# Patient Record
Sex: Female | Born: 1980 | Race: White | Hispanic: No | Marital: Married | Smoking: Never smoker
Health system: Southern US, Community
[De-identification: ages and names within clinical notes are randomized; demographics above are authoritative.]

## PROBLEM LIST (undated history)

## (undated) ENCOUNTER — Inpatient Hospital Stay (HOSPITAL_COMMUNITY): Payer: Self-pay

## (undated) DIAGNOSIS — Z789 Other specified health status: Secondary | ICD-10-CM

## (undated) DIAGNOSIS — R112 Nausea with vomiting, unspecified: Secondary | ICD-10-CM

## (undated) DIAGNOSIS — Z9889 Other specified postprocedural states: Secondary | ICD-10-CM

## (undated) DIAGNOSIS — Z8619 Personal history of other infectious and parasitic diseases: Secondary | ICD-10-CM

## (undated) HISTORY — PX: KNEE SURGERY: SHX244

## (undated) HISTORY — DX: Personal history of other infectious and parasitic diseases: Z86.19

---

## 2005-08-13 ENCOUNTER — Other Ambulatory Visit: Admission: RE | Admit: 2005-08-13 | Discharge: 2005-08-13 | Payer: Self-pay | Admitting: Obstetrics and Gynecology

## 2006-08-14 ENCOUNTER — Other Ambulatory Visit: Admission: RE | Admit: 2006-08-14 | Discharge: 2006-08-14 | Payer: Self-pay | Admitting: Obstetrics and Gynecology

## 2012-01-02 LAB — HM COLONOSCOPY

## 2012-04-13 LAB — OB RESULTS CONSOLE GC/CHLAMYDIA
Chlamydia: NEGATIVE
Gonorrhea: NEGATIVE

## 2012-04-13 LAB — OB RESULTS CONSOLE ABO/RH: RH Type: POSITIVE

## 2012-04-13 LAB — OB RESULTS CONSOLE HIV ANTIBODY (ROUTINE TESTING): HIV: NONREACTIVE

## 2012-04-13 LAB — OB RESULTS CONSOLE RPR: RPR: NONREACTIVE

## 2012-08-27 ENCOUNTER — Encounter (HOSPITAL_COMMUNITY): Payer: Self-pay | Admitting: *Deleted

## 2012-08-27 ENCOUNTER — Inpatient Hospital Stay (HOSPITAL_COMMUNITY)
Admission: AD | Admit: 2012-08-27 | Discharge: 2012-08-27 | Disposition: A | Discharge status: Home / Self Care | Payer: BC Managed Care – PPO | Source: Ambulatory Visit | Attending: Obstetrics and Gynecology | Admitting: Obstetrics and Gynecology

## 2012-08-27 DIAGNOSIS — O368190 Decreased fetal movements, unspecified trimester, not applicable or unspecified: Secondary | ICD-10-CM | POA: Insufficient documentation

## 2012-08-27 HISTORY — DX: Other specified health status: Z78.9

## 2012-08-27 HISTORY — DX: Other specified postprocedural states: R11.2

## 2012-08-27 HISTORY — DX: Other specified postprocedural states: Z98.890

## 2012-08-27 NOTE — MAU Provider Note (Signed)
  History     CSN: [id number]  Arrival date and time: 08/27/12 2204   First Provider Initiated Contact with Patient 08/27/12 2343      Chief Complaint  Patient presents with  . Decreased Fetal Movement   HPI  [name] is a 31 y.o. G1P0 at [redacted]w[redacted]d who presents today with decreased fetal movement. She felt the baby move some at work today, and then when she came home from work the baby was not following her normal movement patterns after dinner.   Past Medical History  Diagnosis Date  . PONV (postoperative nausea and vomiting)   . No pertinent past medical history     Past Surgical History  Procedure Date  . Knee surgery     No family history on file.  History  Substance Use Topics  . Smoking status: Never Smoker   . Smokeless tobacco: Not on file  . Alcohol Use: No    Allergies: Allergies not on file  No prescriptions prior to admission    ROS Physical Exam   Blood pressure 125/69, pulse 72, temperature 98 F (36.7 C), temperature source Oral, resp. rate 20, height 5\' 3"  (1.6 m), weight 53.978 kg (119 lb).  Physical Exam  Nursing note and vitals reviewed. Constitutional: She is oriented to person, place, and time. She appears well-developed and well-nourished.  Cardiovascular: Normal rate.   Respiratory: Effort normal.  GI: Soft.  Genitourinary:        FHT 140, moderate with 15X15 accels, no decels Toco: no UCs.   Neurological: She is alert and oriented to person, place, and time.  Skin: Skin is warm and dry.    MAU Course  Procedures  2350: Spoke with Dr. Henderson Cloud. Pt ok for DC home.   Assessment and Plan   1. Decreased fetal movement in pregnancy, antepartum   Reactive NST Reassuring M-F status FU as scheduled with Dr. Henderson Cloud Fetal Kick counts.  Tawnya Crook 08/27/2012, 11:43 PM

## 2012-08-27 NOTE — MAU Note (Signed)
Patient reports feeling positive fetal movement since being put on efm.

## 2012-08-27 NOTE — MAU Note (Signed)
Baby active earlier today but tonight noticed was not as active. From 2030 to 2130 only felt 3 kicks.

## 2012-10-27 NOTE — L&D Delivery Note (Signed)
Delivery Note At 11:26 AM a viable female was delivered via Vaginal, Spontaneous Delivery (Presentation: ;  ).  APGAR: , ; weight .   Placenta status: Intact, Manual removal>>>intact  Tight nuchal cord X 2>>clamped + cut  Pathology.  Cord: 3 vessels with the following complications: None.  Cord pH: sent  Anesthesia: Epidural  Episiotomy: None Lacerations: 1st degree Suture Repair: 3.0 vicryl rapide Est. Blood Loss (mL): 400  Mom to postpartum.  Baby to nursery-stable.  Meriel Pica 11/12/2012, 11:38 AM

## 2012-11-04 ENCOUNTER — Telehealth (HOSPITAL_COMMUNITY): Payer: Self-pay | Admitting: *Deleted

## 2012-11-04 ENCOUNTER — Encounter (HOSPITAL_COMMUNITY): Payer: Self-pay | Admitting: *Deleted

## 2012-11-04 LAB — OB RESULTS CONSOLE GBS: GBS: NEGATIVE

## 2012-11-04 NOTE — Telephone Encounter (Signed)
Preadmission screen  

## 2012-11-11 ENCOUNTER — Inpatient Hospital Stay (HOSPITAL_COMMUNITY)
Admission: RE | Admit: 2012-11-11 | Discharge: 2012-11-14 | DRG: 372 | Disposition: A | Discharge status: Home / Self Care | Payer: BC Managed Care – PPO | Source: Ambulatory Visit | Attending: Obstetrics and Gynecology | Admitting: Obstetrics and Gynecology

## 2012-11-11 ENCOUNTER — Encounter (HOSPITAL_COMMUNITY): Payer: Self-pay

## 2012-11-11 DIAGNOSIS — O365990 Maternal care for other known or suspected poor fetal growth, unspecified trimester, not applicable or unspecified: Secondary | ICD-10-CM | POA: Diagnosis present

## 2012-11-11 LAB — CBC
Hemoglobin: 12.3 g/dL (ref 12.0–15.0)
Platelets: 215 10*3/uL (ref 150–400)
RBC: 3.55 MIL/uL — ABNORMAL LOW (ref 3.87–5.11)

## 2012-11-11 MED ORDER — MISOPROSTOL 25 MCG QUARTER TABLET
25.0000 ug | ORAL_TABLET | ORAL | Status: DC | PRN
  Administered 2012-11-11 – 2012-11-12 (×2): 25 ug via VAGINAL
  Filled 2012-11-11 (×2): qty 0.25

## 2012-11-11 MED ORDER — ONDANSETRON HCL 4 MG/2ML IJ SOLN: 4.0000 mg | Freq: Four times a day (QID) | INTRAMUSCULAR | Status: DC | PRN

## 2012-11-11 MED ORDER — FLEET ENEMA 7-19 GM/118ML RE ENEM: 1.0000 | ENEMA | Freq: Every day | RECTAL | Status: DC | PRN

## 2012-11-11 MED ORDER — OXYTOCIN 40 UNITS IN LACTATED RINGERS INFUSION - SIMPLE MED
62.5000 mL/h | INTRAVENOUS | Status: DC
  Administered 2012-11-12: 62.5 mL/h via INTRAVENOUS
  Filled 2012-11-11: qty 1000

## 2012-11-11 MED ORDER — OXYTOCIN BOLUS FROM INFUSION: 500.0000 mL | INTRAVENOUS | Status: DC

## 2012-11-11 MED ORDER — CITRIC ACID-SODIUM CITRATE 334-500 MG/5ML PO SOLN: 30.0000 mL | ORAL | Status: DC | PRN

## 2012-11-11 MED ORDER — BUTORPHANOL TARTRATE 1 MG/ML IJ SOLN: 2.0000 mg | INTRAMUSCULAR | Status: DC | PRN

## 2012-11-11 MED ORDER — LIDOCAINE HCL (PF) 1 % IJ SOLN
30.0000 mL | INTRAMUSCULAR | Status: DC | PRN
  Filled 2012-11-11: qty 30

## 2012-11-11 MED ORDER — LACTATED RINGERS IV SOLN
INTRAVENOUS | Status: DC
  Administered 2012-11-11 – 2012-11-12 (×2): via INTRAVENOUS

## 2012-11-11 MED ORDER — TERBUTALINE SULFATE 1 MG/ML IJ SOLN: 0.2500 mg | Freq: Once | INTRAMUSCULAR | Status: AC | PRN

## 2012-11-11 MED ORDER — ACETAMINOPHEN 325 MG PO TABS: 650.0000 mg | ORAL_TABLET | ORAL | Status: DC | PRN

## 2012-11-11 MED ORDER — LACTATED RINGERS IV SOLN
500.0000 mL | INTRAVENOUS | Status: DC | PRN
  Administered 2012-11-12: 1000 mL via INTRAVENOUS

## 2012-11-11 MED ORDER — OXYCODONE-ACETAMINOPHEN 5-325 MG PO TABS: 1.0000 | ORAL_TABLET | ORAL | Status: DC | PRN

## 2012-11-11 MED ORDER — ZOLPIDEM TARTRATE 5 MG PO TABS: 5.0000 mg | ORAL_TABLET | Freq: Every evening | ORAL | Status: DC | PRN

## 2012-11-11 MED ORDER — IBUPROFEN 600 MG PO TABS: 600.0000 mg | ORAL_TABLET | Freq: Four times a day (QID) | ORAL | Status: DC | PRN

## 2012-11-11 NOTE — Progress Notes (Signed)
Run of 5 uc's a min. apart

## 2012-11-12 ENCOUNTER — Encounter (HOSPITAL_COMMUNITY): Payer: Self-pay | Admitting: Anesthesiology

## 2012-11-12 ENCOUNTER — Encounter (HOSPITAL_COMMUNITY): Payer: Self-pay

## 2012-11-12 ENCOUNTER — Inpatient Hospital Stay (HOSPITAL_COMMUNITY): Payer: BC Managed Care – PPO | Admitting: Anesthesiology

## 2012-11-12 LAB — RPR: RPR Ser Ql: NONREACTIVE

## 2012-11-12 MED ORDER — ZOLPIDEM TARTRATE 5 MG PO TABS: 5.0000 mg | ORAL_TABLET | Freq: Every evening | ORAL | Status: DC | PRN

## 2012-11-12 MED ORDER — OXYTOCIN 40 UNITS IN LACTATED RINGERS INFUSION - SIMPLE MED: 1.0000 m[IU]/min | INTRAVENOUS | Status: DC

## 2012-11-12 MED ORDER — ONDANSETRON HCL 4 MG/2ML IJ SOLN: 4.0000 mg | INTRAMUSCULAR | Status: DC | PRN

## 2012-11-12 MED ORDER — OXYCODONE-ACETAMINOPHEN 5-325 MG PO TABS: 1.0000 | ORAL_TABLET | Freq: Four times a day (QID) | ORAL | Status: DC | PRN

## 2012-11-12 MED ORDER — TERBUTALINE SULFATE 1 MG/ML IJ SOLN: 0.2500 mg | Freq: Once | INTRAMUSCULAR | Status: DC | PRN

## 2012-11-12 MED ORDER — DIBUCAINE 1 % RE OINT: 1.0000 "application " | TOPICAL_OINTMENT | RECTAL | Status: DC | PRN

## 2012-11-12 MED ORDER — LACTATED RINGERS IV SOLN
500.0000 mL | Freq: Once | INTRAVENOUS | Status: AC
  Administered 2012-11-12: 500 mL via INTRAVENOUS

## 2012-11-12 MED ORDER — BENZOCAINE-MENTHOL 20-0.5 % EX AERO
1.0000 "application " | INHALATION_SPRAY | CUTANEOUS | Status: DC | PRN
  Filled 2012-11-12: qty 56

## 2012-11-12 MED ORDER — SENNOSIDES-DOCUSATE SODIUM 8.6-50 MG PO TABS
2.0000 | ORAL_TABLET | Freq: Every day | ORAL | Status: DC
  Administered 2012-11-12 – 2012-11-13 (×2): 2 via ORAL

## 2012-11-12 MED ORDER — LIDOCAINE HCL (PF) 1 % IJ SOLN
INTRAMUSCULAR | Status: DC | PRN
  Administered 2012-11-12 (×2): 5 mL

## 2012-11-12 MED ORDER — FENTANYL 2.5 MCG/ML BUPIVACAINE 1/10 % EPIDURAL INFUSION (WH - ANES)
14.0000 mL/h | INTRAMUSCULAR | Status: DC
  Administered 2012-11-12: 14 mL/h via EPIDURAL

## 2012-11-12 MED ORDER — WITCH HAZEL-GLYCERIN EX PADS: 1.0000 "application " | MEDICATED_PAD | CUTANEOUS | Status: DC | PRN

## 2012-11-12 MED ORDER — EPHEDRINE 5 MG/ML INJ: 10.0000 mg | INTRAVENOUS | Status: DC | PRN

## 2012-11-12 MED ORDER — MEASLES, MUMPS & RUBELLA VAC ~~LOC~~ INJ
0.5000 mL | INJECTION | Freq: Once | SUBCUTANEOUS | Status: DC
  Filled 2012-11-12: qty 0.5

## 2012-11-12 MED ORDER — TETANUS-DIPHTH-ACELL PERTUSSIS 5-2.5-18.5 LF-MCG/0.5 IM SUSP: 0.5000 mL | Freq: Once | INTRAMUSCULAR | Status: DC

## 2012-11-12 MED ORDER — DIPHENHYDRAMINE HCL 25 MG PO CAPS: 25.0000 mg | ORAL_CAPSULE | Freq: Four times a day (QID) | ORAL | Status: DC | PRN

## 2012-11-12 MED ORDER — ONDANSETRON HCL 4 MG PO TABS: 4.0000 mg | ORAL_TABLET | ORAL | Status: DC | PRN

## 2012-11-12 MED ORDER — PHENYLEPHRINE 40 MCG/ML (10ML) SYRINGE FOR IV PUSH (FOR BLOOD PRESSURE SUPPORT): 80.0000 ug | PREFILLED_SYRINGE | INTRAVENOUS | Status: DC | PRN

## 2012-11-12 MED ORDER — DIPHENHYDRAMINE HCL 50 MG/ML IJ SOLN: 12.5000 mg | INTRAMUSCULAR | Status: DC | PRN

## 2012-11-12 MED ORDER — BISACODYL 10 MG RE SUPP: 10.0000 mg | Freq: Every day | RECTAL | Status: DC | PRN

## 2012-11-12 MED ORDER — EPHEDRINE 5 MG/ML INJ
INTRAVENOUS | Status: AC
  Filled 2012-11-12: qty 4

## 2012-11-12 MED ORDER — IBUPROFEN 800 MG PO TABS
800.0000 mg | ORAL_TABLET | Freq: Three times a day (TID) | ORAL | Status: DC | PRN
  Administered 2012-11-12 – 2012-11-14 (×6): 800 mg via ORAL
  Filled 2012-11-12 (×7): qty 1

## 2012-11-12 MED ORDER — LANOLIN HYDROUS EX OINT: TOPICAL_OINTMENT | CUTANEOUS | Status: DC | PRN

## 2012-11-12 MED ORDER — PRENATAL MULTIVITAMIN CH
1.0000 | ORAL_TABLET | Freq: Every day | ORAL | Status: DC
  Administered 2012-11-13 – 2012-11-14 (×2): 1 via ORAL
  Filled 2012-11-12 (×2): qty 1

## 2012-11-12 MED ORDER — PHENYLEPHRINE 40 MCG/ML (10ML) SYRINGE FOR IV PUSH (FOR BLOOD PRESSURE SUPPORT)
PREFILLED_SYRINGE | INTRAVENOUS | Status: AC
  Filled 2012-11-12: qty 5

## 2012-11-12 MED ORDER — SIMETHICONE 80 MG PO CHEW: 80.0000 mg | CHEWABLE_TABLET | ORAL | Status: DC | PRN

## 2012-11-12 MED ORDER — FENTANYL 2.5 MCG/ML BUPIVACAINE 1/10 % EPIDURAL INFUSION (WH - ANES)
INTRAMUSCULAR | Status: AC
  Filled 2012-11-12: qty 125

## 2012-11-12 MED ORDER — FLEET ENEMA 7-19 GM/118ML RE ENEM: 1.0000 | ENEMA | Freq: Every day | RECTAL | Status: DC | PRN

## 2012-11-12 MED ORDER — EPHEDRINE 5 MG/ML INJ
10.0000 mg | INTRAVENOUS | Status: DC | PRN
  Administered 2012-11-12: 10 mg via INTRAVENOUS

## 2012-11-12 NOTE — H&P (Signed)
[name], [name]              ACCOUNT NO.:  [id number]  MEDICAL RECORD NO.:  [id number]  LOCATION:  9168                          FACILITY:  [facility]  PHYSICIAN:  Duke Salvia. Marcelle Overlie, M.D.DATE OF BIRTH:  11/16/1980  DATE OF ADMISSION:  11/11/2012 DATE OF DISCHARGE:                             HISTORY & PHYSICAL   CHIEF COMPLAINT:  Labor induction at term, SGA.  HISTORY OF PRESENT ILLNESS:  A 32 year old, G1 P0, EDD November 18, 2012. This patient has been followed since 34 weeks was size less than dates, was noted to have an EFW below the 10th percentile, 1, 2, 5-pound 2- ounce with normal AFI BPP 8/8.  Followup ultrasound November 12, 2012 AF was normal, BPP was 8/8 with the EFW at the 7th percentile.  She presents for labor induction at 39 weeks for SGA.  GBS is negative.  Blood type is A positive.  PAST MEDICAL HISTORY:  Please see the [facility] form for details.  PHYSICAL EXAMINATION:  VITAL SIGNS:  Temp 98.2, blood pressure 118/78. HEENT:  Unremarkable. NECK:  Supple without masses. LUNGS:  Clear. CARDIOVASCULAR:  Regular rhythm without murmurs, rubs, gallops. BREASTS:  Not examined. PELVIC:  Fundal height 35 cm.  Vertex fetal heart rate 140 cervix was closed, thick, and high.  IMPRESSION:  A 39-week IUP, SGA, normal AFI.  PLAN:  A two-stage labor induction.  The labor induction protocol for Cytotec and Pitocin.  Specific risks related to induction increase in the risk for cesarean delivery review.  The patient understands and accepts Dr. Marcelle Overlie.     Sandee Bernath M. Marcelle Overlie, M.D.     RMH/MEDQ  D:  11/11/2012  T:  11/11/2012  Job:  [id number]

## 2012-11-12 NOTE — Progress Notes (Signed)
SROM, now 1+/80%/-2, will start pit aug

## 2012-11-12 NOTE — Anesthesia Procedure Notes (Signed)
Epidural Patient location during procedure: OB Start time: 11/12/2012 9:22 AM  Staffing Anesthesiologist: Angus Seller., Harrell Gave. Performed by: anesthesiologist   Preanesthetic Checklist Completed: patient identified, site marked, surgical consent, pre-op evaluation, timeout performed, IV checked, risks and benefits discussed and monitors and equipment checked  Epidural Patient position: sitting Prep: site prepped and draped and DuraPrep Patient monitoring: continuous pulse ox and blood pressure Approach: midline Injection technique: LOR air and LOR saline  Needle:  Needle type: Tuohy  Needle gauge: 17 G Needle length: 9 cm and 9 Needle insertion depth: 3 cm Catheter type: closed end flexible Catheter size: 19 Gauge Catheter at skin depth: 9 cm Test dose: negative  Assessment Events: blood not aspirated, injection not painful, no injection resistance, negative IV test and no paresthesia  Additional Notes Patient identified.  Risk benefits discussed including failed block, incomplete pain control, headache, nerve damage, paralysis, blood pressure changes, nausea, vomiting, reactions to medication both toxic or allergic, and postpartum back pain.  Patient expressed understanding and wished to proceed.  All questions were answered.  Sterile technique used throughout procedure and epidural site dressed with sterile barrier dressing. No paresthesia or other complications noted.The patient did not experience any signs of intravascular injection such as tinnitus or metallic taste in mouth nor signs of intrathecal spread such as rapid motor block. Please see nursing notes for vital signs.

## 2012-11-12 NOTE — Anesthesia Preprocedure Evaluation (Signed)
Anesthesia Evaluation  Patient identified by MRN, date of birth, ID band Patient awake    Reviewed: Allergy & Precautions, H&P , Patient's Chart, lab work & pertinent test results  History of Anesthesia Complications (+) PONV  Airway Mallampati: II TM Distance: >3 FB Neck ROM: full    Dental No notable dental hx.    Pulmonary neg pulmonary ROS,  breath sounds clear to auscultation  Pulmonary exam normal       Cardiovascular negative cardio ROS  Rhythm:regular Rate:Normal     Neuro/Psych negative neurological ROS  negative psych ROS   GI/Hepatic negative GI ROS, Neg liver ROS,   Endo/Other  negative endocrine ROS  Renal/GU negative Renal ROS     Musculoskeletal   Abdominal   Peds  Hematology negative hematology ROS (+)   Anesthesia Other Findings PONV (postoperative nausea and vomiting)     No pertinent past medical history        H/O varicella    Reproductive/Obstetrics (+) Pregnancy                           Anesthesia Physical Anesthesia Plan  ASA: II  Anesthesia Plan: Epidural   Post-op Pain Management:    Induction:   Airway Management Planned:   Additional Equipment:   Intra-op Plan:   Post-operative Plan:   Informed Consent: I have reviewed the patients History and Physical, chart, labs and discussed the procedure including the risks, benefits and alternatives for the proposed anesthesia with the patient or authorized representative who has indicated his/her understanding and acceptance.     Plan Discussed with:   Anesthesia Plan Comments:         Anesthesia Quick Evaluation

## 2012-11-13 LAB — CBC
HCT: 31.9 % — ABNORMAL LOW (ref 36.0–46.0)
Hemoglobin: 11.1 g/dL — ABNORMAL LOW (ref 12.0–15.0)
MCH: 34 pg (ref 26.0–34.0)
MCHC: 34.8 g/dL (ref 30.0–36.0)
RDW: 12.6 % (ref 11.5–15.5)

## 2012-11-13 NOTE — Anesthesia Postprocedure Evaluation (Signed)
Anesthesia Post Note  Patient: [name]  Procedure(s) Performed: * No procedures listed *  Anesthesia type: Epidural  Patient location: Mother/Baby  Post pain: Pain level controlled  Post assessment: Post-op Vital signs reviewed  Last Vitals:  Filed Vitals:   11/13/12 0532  BP: 119/80  Pulse: 92  Temp: 36.6 C  Resp: 18    Post vital signs: Reviewed  Level of consciousness:alert  Complications: No apparent anesthesia complications

## 2012-11-13 NOTE — Progress Notes (Signed)
Post Partum Day 1 Subjective: no complaints  Objective: Blood pressure 119/80, pulse 92, temperature 97.8 F (36.6 C), temperature source Oral, resp. rate 18, height 5\' 3"  (1.6 m), weight 127 lb (57.607 kg), SpO2 100.00%, unknown if currently breastfeeding.  Physical Exam:  General: alert Lochia: appropriate Uterine Fundus: firm Incision: healing well DVT Evaluation: No evidence of DVT seen on physical exam.   Basename 11/13/12 0529 11/11/12 1955  HGB 11.1* 12.3  HCT 31.9* 34.9*    Assessment/Plan: Plan for discharge tomorrow   LOS: 2 days   Sophonie Goforth M 11/13/2012, 7:34 AM

## 2012-11-14 MED ORDER — IBUPROFEN 800 MG PO TABS: 800.0000 mg | ORAL_TABLET | Freq: Three times a day (TID) | ORAL | Status: DC | PRN

## 2012-11-14 NOTE — Progress Notes (Signed)
Post Partum Day 2 Subjective: no complaints  Objective: Blood pressure 108/70, pulse 79, temperature 97.8 F (36.6 C), temperature source Oral, resp. rate 18, height 5\' 3"  (1.6 m), weight 127 lb (57.607 kg), SpO2 100.00%, unknown if currently breastfeeding.  Physical Exam:  General: alert Lochia: appropriate Uterine Fundus: firm Incision: healing well DVT Evaluation: No evidence of DVT seen on physical exam.   Basename 11/13/12 0529 11/11/12 1955  HGB 11.1* 12.3  HCT 31.9* 34.9*    Assessment/Plan: Discharge home   LOS: 3 days   Keily Lepp M 11/14/2012, 10:43 AM

## 2012-11-14 NOTE — Discharge Summary (Signed)
Obstetric Discharge Summary Reason for Admission: induction of labor Prenatal Procedures: NST and ultrasound Intrapartum Procedures: spontaneous vaginal delivery Postpartum Procedures: none Complications-Operative and Postpartum: none Hemoglobin  Date Value Range Status  11/13/2012 11.1* 12.0 - 15.0 g/dL Final     HCT  Date Value Range Status  11/13/2012 31.9* 36.0 - 46.0 % Final    Physical Exam:  General: alert Lochia: appropriate Uterine Fundus: firm Incision: healing well DVT Evaluation: No evidence of DVT seen on physical exam.  Discharge Diagnoses: Term Pregnancy-delivered and IOL for SGA  Discharge Information: Date: 11/14/2012 Activity: pelvic rest Diet: routine Medications: PNV and Ibuprofen Condition: stable Instructions: refer to practice specific booklet Discharge to: home Follow-up Information    Follow up with Meriel Pica, MD. Schedule an appointment as soon as possible for a visit in 6 weeks.   Contact information:   [address] [place] [address] [phone]          Newborn Data: Live born female  Birth Weight: 5 lb 3.6 oz (2370 g) APGAR: 8, 9  Home with mother.  Dyanne Yorks M 11/14/2012, 10:45 AM

## 2012-11-15 NOTE — Progress Notes (Signed)
Post discharge chart review completed.  

## 2012-11-19 ENCOUNTER — Ambulatory Visit (HOSPITAL_COMMUNITY)
Admission: RE | Admit: 2012-11-19 | Discharge: 2012-11-19 | Disposition: A | Discharge status: Home / Self Care | Payer: BC Managed Care – PPO | Source: Ambulatory Visit | Attending: Obstetrics and Gynecology | Admitting: Obstetrics and Gynecology

## 2012-11-19 NOTE — Progress Notes (Signed)
Adult Lactation Consultation Outpatient Visit Note  Patient Name: [name]                 DOB: [birth year], 60 week old today Date of Birth: 1980-12-11                           BW: 5-3 Gestational Age at Delivery: [redacted]w[redacted]d       todays weight :4-11.4, 2138  Type of Delivery: vaginal delivery  Breastfeeding History: Frequency of Breastfeeding: every 2-3 hours Length of Feeding: 30-45 mins Voids: 10-12 Stools: 4 brownish yellow seedy  Supplementing / Method: none Pumping:  Type of Pump:Medela  Pump n style   Frequency: None  Volume:    Comments: Mother was induced at 39 weeks for SGA. [name] weighed 5.3 at birth. [name] lost to 4-10 and has only gained 1 ounce since birth.   Mother was fit with #16 nipple shield while in hospital due to poor latch. Mother has firm breast tissue with short nipple shaft. Mother has not started pumping yet. She doesn't want to use formula or bottles. Domenic Moras has not been supplement. She has exclusively been feeding from the breast using the nipple shield.   Consultation Evaluation: Mothers breast are firm. Nipples are intact with no trauma observed. #16 nipple shield used to latch [name] on the (R) breast. Infant has poor latch with shield, observed sliding back and forth on tip of shaft , with bottom lip rolling up and dimpled cheeks. Mother letting go of breast after latching and not using good support. Mother inst in latching infant deeper on breast, using better support and breast compression. Several attempts to obtain good deep latch. Infant sustained latch for 15-20 mins. Infant transferred 20 ml from (R) breast, using good breast compression.  Initial Feeding Assessment: Pre-feed [id number]:[id number] Post-feed XBJYNW:2956 Amount Transferred:76ml Comments:  Additional Feeding Assessment:infant latched to (L) breast without nipple shield for 15 mins. Latch was much better without nipple shield. Infant transferred 22 ml . Pre-feed OZHYQM:5784 Post-feed  ONGEXB:[id number] Amount Transferred:62ml  Comments: Mother was post pumped and obtained 25 ml. SNS was sat up and infant took another 10ml of EBM.   Additional Feeding Assessment:  Pre-feed Weight: Post-feed Weight: Amount Transferred: Comments:  Total Breast milk Transferred this Visit: 42ml Total Supplement Given: 10ml EBM with SNS Total feeding:52 ml  Additional Interventions :1) mother was inst to breastfeed infant on cue and at least every 3 hours. inst mother to be more aware of proper latch . inst mother to offer breast without nipple shield and if infant unable to sustain good deep latch to use nipple shield. 2) inst to supplement 15-30 ml of EBM with Method of choice,(SNS, finger feeding or bottle), after each feeding.  3)Mother inst to begin pumping after each feeding for 15-20 mins at least 6-8 times daily  Mother has Smart Start to follow up on Monday for weight check. Lactation follow up for feeding and test weights on January 30 at 9 am. Follow with Dr Avis Epley on January 31.  Follow-Up   January30 at 9a,m.   Stevan Born [facility] 11/19/2012, 10:36 AM

## 2012-11-25 ENCOUNTER — Ambulatory Visit (HOSPITAL_COMMUNITY)
Admission: RE | Admit: 2012-11-25 | Discharge: 2012-11-25 | Disposition: A | Discharge status: Home / Self Care | Payer: BC Managed Care – PPO | Source: Ambulatory Visit | Attending: Obstetrics and Gynecology | Admitting: Obstetrics and Gynecology

## 2012-11-25 NOTE — Progress Notes (Signed)
Adult Lactation Consultation Outpatient Visit Note  Patient Name: [name] Date of Birth: 10-23-1981 Gestational Age at Delivery: [redacted]w[redacted]d Type of Delivery:   Breastfeeding History: Frequency of Breastfeeding: 8-14 times a day Length of Feeding:  Voids: 6 Stools: 5  Supplementing / Method: Pumping:  Type of Pump:  PIS   Frequency:6 times a day  Volume:  30 ml  Comments:  Weight has increased 4 oz since Monday.  Mother reports that BF is going better and is not currently using the Nipple Shield.  Consultation Evaluation:  Initial Feeding Assessment: Pre-feed Weight: 2384 Post-feed [id number]:[id number] Amount Transferred:22 Comments:  Additional Feeding Assessment: Pre-feed ZHYQMV:7846 Post-feed [id number]:8413 Amount Transferred:58 Comments:   Total Breast milk Transferred this Visit: 58 Total Supplement Given: 0  Baby is latching well.  Assisted mother with some minor position changes.  She reported decreased pain and the baby suckled more deeply.   Follow-Up   Mother's milk supply is more than adequate so she will decrease pumping to 3 times a day for 10 minutes after feedings. [name]'s weight is increasing well.  Supplement will decrease from 15 ml twice a day to 15 ml once a day. She will see the pediatrician on Monday and will attend the BF support group at the [facility].   Soyla Dryer 11/25/2012, 10:11 AM

## 2012-12-11 ENCOUNTER — Other Ambulatory Visit: Payer: Self-pay

## 2013-09-01 ENCOUNTER — Other Ambulatory Visit: Payer: Self-pay

## 2014-06-30 LAB — OB RESULTS CONSOLE ABO/RH: RH Type: POSITIVE

## 2014-06-30 LAB — OB RESULTS CONSOLE RUBELLA ANTIBODY, IGM: Rubella: IMMUNE

## 2014-06-30 LAB — OB RESULTS CONSOLE HIV ANTIBODY (ROUTINE TESTING): HIV: NONREACTIVE

## 2014-06-30 LAB — OB RESULTS CONSOLE HEPATITIS B SURFACE ANTIGEN: HEP B S AG: NEGATIVE

## 2014-06-30 LAB — OB RESULTS CONSOLE GC/CHLAMYDIA
CHLAMYDIA, DNA PROBE: NEGATIVE
GC PROBE AMP, GENITAL: NEGATIVE

## 2014-06-30 LAB — OB RESULTS CONSOLE RPR: RPR: NONREACTIVE

## 2014-07-30 ENCOUNTER — Inpatient Hospital Stay (HOSPITAL_COMMUNITY)
Admission: AD | Admit: 2014-07-30 | Discharge: 2014-07-30 | Disposition: A | Discharge status: Home / Self Care | Payer: BC Managed Care – PPO | Source: Ambulatory Visit | Attending: Obstetrics and Gynecology | Admitting: Obstetrics and Gynecology

## 2014-07-30 ENCOUNTER — Encounter (HOSPITAL_COMMUNITY): Payer: Self-pay

## 2014-07-30 DIAGNOSIS — O210 Mild hyperemesis gravidarum: Secondary | ICD-10-CM | POA: Diagnosis not present

## 2014-07-30 DIAGNOSIS — O219 Vomiting of pregnancy, unspecified: Secondary | ICD-10-CM

## 2014-07-30 LAB — URINALYSIS, ROUTINE W REFLEX MICROSCOPIC
Bilirubin Urine: NEGATIVE
GLUCOSE, UA: NEGATIVE mg/dL
HGB URINE DIPSTICK: NEGATIVE
KETONES UR: NEGATIVE mg/dL
Nitrite: NEGATIVE
PH: 6.5 (ref 5.0–8.0)
PROTEIN: NEGATIVE mg/dL
Specific Gravity, Urine: 1.02 (ref 1.005–1.030)
Urobilinogen, UA: 0.2 mg/dL (ref 0.0–1.0)

## 2014-07-30 LAB — URINE MICROSCOPIC-ADD ON

## 2014-07-30 NOTE — MAU Provider Note (Signed)
History     CSN: [id number]  Arrival date and time: 07/30/14 1191   First Provider Initiated Contact with Patient 07/30/14 [phone]      Chief Complaint  Patient presents with  . Food poisoning    HPI [name] 33 y.o. G2P1001 @[redacted]w[redacted]d  presents to MAU complaining of vomiting with fever, chills and weakness that started yesterday.  She thinks she may have eaten something last evening that did not agree with her.  She had vomiting 2x last night and none since 3am today.  Other associated symptoms include feeling feverish (but temp not checked), chills, sweats and weakness.  She has had some water this morning and no problems keeping that down.  No further nausea or vomiting since 3am.  She has not used any medications.  She denies diarrhea, constipation, abdominal pain, bleeding.   OB History   Grav Para Term Preterm Abortions TAB SAB Ect Mult Living   2 1 1       1       Past Medical History  Diagnosis Date  . No pertinent past medical history   . H/O varicella   . PONV (postoperative nausea and vomiting)     Past Surgical History  Procedure Laterality Date  . Knee surgery      Family History  Problem Relation Age of Onset  . Other Mother     hemorrage post RVD requiring hysterectomy  . Hypertension Father   . Diabetes Maternal Uncle   . Stroke Maternal Grandmother   . Thyroid disease Maternal Grandfather   . COPD Maternal Grandfather   . Heart disease Paternal Grandfather   . Parkinsonism Paternal Grandfather   . Arthritis Paternal Grandmother     History  Substance Use Topics  . Smoking status: Never Smoker   . Smokeless tobacco: Never Used  . Alcohol Use: No    Allergies: No Known Allergies  Prescriptions prior to admission  Medication Sig Dispense Refill  . Prenatal Vit-Fe Fumarate-FA (PRENATAL MULTIVITAMIN) TABS Take 1 tablet by mouth daily.        Review of Systems  Constitutional: Positive for fever, chills and diaphoresis.  HENT: Negative for  congestion and sore throat.   Eyes: Negative for blurred vision and double vision.  Respiratory: Positive for shortness of breath. Negative for cough and wheezing.   Cardiovascular: Negative for chest pain and palpitations.  Gastrointestinal: Positive for nausea and vomiting. Negative for heartburn, abdominal pain, diarrhea and constipation.  Genitourinary: Positive for urgency and frequency. Negative for dysuria and hematuria.  Musculoskeletal: Negative for back pain and neck pain.  Skin: Negative for itching and rash.  Neurological: Positive for weakness. Negative for dizziness, tingling and headaches.  Psychiatric/Behavioral: Negative for depression, suicidal ideas and substance abuse. The patient is not nervous/anxious.    Physical Exam   Blood pressure 113/77, pulse 88, temperature 97.8 F (36.6 C), temperature source Oral, resp. rate 18, not currently breastfeeding.  Physical Exam  Constitutional: She is oriented to person, place, and time. She appears well-developed and well-nourished.  HENT:  Head: Normocephalic and atraumatic.  Eyes: EOM are normal.  Neck: Normal range of motion.  Cardiovascular: Normal rate, regular rhythm and normal heart sounds.   Respiratory: Effort normal and breath sounds normal. No respiratory distress.  GI: Soft. Bowel sounds are normal. She exhibits no distension. There is no tenderness. There is no rebound and no guarding.  Musculoskeletal: Normal range of motion.  Neurological: She is alert and oriented to person, place, and time.  Skin: Skin is warm and dry.  Psychiatric: She has a normal mood and affect.   Results for orders placed during the hospital encounter of 07/30/14 (from the past 24 hour(s))  URINALYSIS, ROUTINE W REFLEX MICROSCOPIC     Status: Abnormal   Collection Time    07/30/14  8:37 AM      Result Value Ref Range   Color, Urine YELLOW  YELLOW   APPearance HAZY (*) CLEAR   Specific Gravity, Urine 1.020  1.005 - 1.030   pH 6.5   5.0 - 8.0   Glucose, UA NEGATIVE  NEGATIVE mg/dL   Hgb urine dipstick NEGATIVE  NEGATIVE   Bilirubin Urine NEGATIVE  NEGATIVE   Ketones, ur NEGATIVE  NEGATIVE mg/dL   Protein, ur NEGATIVE  NEGATIVE mg/dL   Urobilinogen, UA 0.2  0.0 - 1.0 mg/dL   Nitrite NEGATIVE  NEGATIVE   Leukocytes, UA MODERATE (*) NEGATIVE  URINE MICROSCOPIC-ADD ON     Status: Abnormal   Collection Time    07/30/14  8:37 AM      Result Value Ref Range   Squamous Epithelial / LPF MANY (*) RARE   WBC, UA 21-50  <3 WBC/hpf   RBC / HPF 0-2  <3 RBC/hpf   Bacteria, UA MANY (*) RARE    MAU Course  Procedures  MDM Discussed with Dr. Henderson Cloudomblin.  Fetal heart tones heard at 147, no fever, no further N/V, U/A with mod leuks.  Advised to send urine for culture and okay to discharge patient.    Assessment and Plan  A: Nausea and vomiting in pregnancy  P: Discharge to home Encourage po fluids Urine culture pending Follow up in clinic as scheduled Return to MAU for emergency  Bertram Denvereague Clark, Karen E 07/30/2014, 8:57 AM

## 2014-07-30 NOTE — Discharge Instructions (Signed)
Food Poisoning °Food poisoning is an illness caused by something you ate or drank. There are over 250 known causes of food poisoning. However, many other causes are unknown. You can be treated even if the exact cause of your food poisoning is not known. In most cases, food poisoning is mild and lasts 1 to 2 days. However, some cases can be serious, especially for people with low immune systems, the elderly, children and infants, and pregnant women. °CAUSES  °Poor personal hygiene, improper cleaning of storage and preparation areas, and unclean utensils can cause infection or tainting (contamination) of foods. The causes of food poisoning are numerous. Infectious agents, such as viruses, bacteria, or parasites, can cause harm by infecting the intestine and disrupting the absorption of nutrients and water. This can cause diarrhea and lead to dehydration. Viruses are responsible for most of the food poisonings in which an agent is found. Parasites are less likely to cause food poisoning. Toxic agents, such as poisonous mushrooms, marine algae, and pesticides can also cause food poisoning. °· Viral causes of food poisoning include: °¨ Norovirus. °¨ Rotavirus. °¨ Hepatitis A. °· Bacterial causes of food poisoning include: °¨ Salmonellae. °¨ Campylobacter. °¨ Bacillus cereus. °¨ Escherichia coli (E. coli). °¨ Shigella. °¨ Listeria monocytogenes. °¨ Clostridium botulinum (botulism). °¨ Vibrio cholerae. °· Parasites that can cause food poisoning include: °¨ Giardia. °¨ Cryptosporidium. °¨ Toxoplasma. °SYMPTOMS °Symptoms may appear several hours or longer after consuming the contaminated food or drink. Symptoms may include: °· Nausea. °· Vomiting. °· Cramping. °· Diarrhea. °· Fever and chills. °· Muscle aches. °DIAGNOSIS °Your health care provider may be able to diagnose food poisoning from a list of what you have recently eaten and results from lab tests. Diagnostic tests may include an exam of the feces. °TREATMENT °In  most cases, treatment focuses on helping to relieve your symptoms and staying well hydrated. Antibiotic medicines are rarely needed. In severe cases, hospitalization may be required. °HOME CARE INSTRUCTIONS  °· Drink enough water and fluids to keep your urine clear or pale yellow. Drink small amounts of fluids frequently and increase as tolerated. °· Ask your health care provider for specific rehydration instructions. °· Avoid: °¨ Foods high in sugar. °¨ Alcohol. °¨ Carbonated drinks. °¨ Tobacco. °¨ Juice. °¨ Caffeine drinks. °¨ Extremely hot or cold fluids. °¨ Fatty, greasy foods. °¨ Too much intake of anything at one time. °¨ Dairy products until 24 to 48 hours after diarrhea stops. °· You may consume probiotics. Probiotics are active cultures of beneficial bacteria. They may lessen the amount and number of diarrheal stools in adults. Probiotics can be found in yogurt with active cultures and in supplements. °· Wash your hands well to avoid spreading the bacteria. °· Take medicines only as directed by your health care provider. Do not give your child aspirin because of the association with Reye's syndrome. °· Ask your health care provider if you should continue to take your regular prescribed and over-the-counter medicines. °PREVENTION  °· Wash your hands, food preparation surfaces, and utensils thoroughly before and after handling raw foods. °· Keep refrigerated foods below 40°F (5°C). °· Serve hot foods immediately or keep them heated above 140°F (60°C). °· Divide large volumes of food into small portions for rapid cooling in the refrigerator. Hot, bulky foods in the refrigerator can raise the temperature of other foods that have already cooled. °· Follow approved canning procedures. °· Heat canned foods thoroughly before tasting. °· When in doubt, throw it out. °· Infants, the elderly, women   who are pregnant, and people with compromised immune systems are especially susceptible to food poisoning. These people  should never consume unpasteurized cheese, unpasteurized cider, raw fish, raw seafood, or raw meat-type products. SEEK IMMEDIATE MEDICAL CARE IF:   You have difficulty breathing, swallowing, talking, or moving.  You develop blurred vision.  You are unable to keep fluids down.  You faint or nearly faint.  Your eyes turn yellow.  Vomiting or diarrhea develops or becomes persistent.  Abdominal pain develops, increases, or localizes in one small area.  You have a fever.  The diarrhea becomes excessive or contains blood or mucus.  You develop excessive weakness, dizziness, or extreme thirst.  You have no urine for 8 hours. MAKE SURE YOU:   Understand these instructions.  Will watch your condition.  Will get help right away if you are not doing well or get worse. Document Released: 07/11/2004 Document Revised: 02/27/2014 Document Reviewed: 02/27/2011 [facility] Patient Information 2015 [place], [place]. This information is not intended to replace advice given to you by your health care provider. Make sure you discuss any questions you have with your health care provider. Nausea and Vomiting Nausea is a sick feeling that often comes before throwing up (vomiting). Vomiting is a reflex where stomach contents come out of your mouth. Vomiting can cause severe loss of body fluids (dehydration). Children and elderly adults can become dehydrated quickly, especially if they also have diarrhea. Nausea and vomiting are symptoms of a condition or disease. It is important to find the cause of your symptoms. CAUSES   Direct irritation of the stomach lining. This irritation can result from increased acid production (gastroesophageal reflux disease), infection, food poisoning, taking certain medicines (such as nonsteroidal anti-inflammatory drugs), alcohol use, or tobacco use.  Signals from the brain.These signals could be caused by a headache, heat exposure, an inner ear disturbance, increased  pressure in the brain from injury, infection, a tumor, or a concussion, pain, emotional stimulus, or metabolic problems.  An obstruction in the gastrointestinal tract (bowel obstruction).  Illnesses such as diabetes, hepatitis, gallbladder problems, appendicitis, kidney problems, cancer, sepsis, atypical symptoms of a heart attack, or eating disorders.  Medical treatments such as chemotherapy and radiation.  Receiving medicine that makes you sleep (general anesthetic) during surgery. DIAGNOSIS Your caregiver may ask for tests to be done if the problems do not improve after a few days. Tests may also be done if symptoms are severe or if the reason for the nausea and vomiting is not clear. Tests may include:  Urine tests.  Blood tests.  Stool tests.  Cultures (to look for evidence of infection).  X-rays or other imaging studies. Test results can help your caregiver make decisions about treatment or the need for additional tests. TREATMENT You need to stay well hydrated. Drink frequently but in small amounts.You may wish to drink water, sports drinks, clear broth, or eat frozen ice pops or gelatin dessert to help stay hydrated.When you eat, eating slowly may help prevent nausea.There are also some antinausea medicines that may help prevent nausea. HOME CARE INSTRUCTIONS   Take all medicine as directed by your caregiver.  If you do not have an appetite, do not force yourself to eat. However, you must continue to drink fluids.  If you have an appetite, eat a normal diet unless your caregiver tells you differently.  Eat a variety of complex carbohydrates (rice, wheat, potatoes, bread), lean meats, yogurt, fruits, and vegetables.  Avoid high-fat foods because they are more difficult  to digest.  Drink enough water and fluids to keep your urine clear or pale yellow.  If you are dehydrated, ask your caregiver for specific rehydration instructions. Signs of dehydration may  include:  Severe thirst.  Dry lips and mouth.  Dizziness.  Dark urine.  Decreasing urine frequency and amount.  Confusion.  Rapid breathing or pulse. SEEK IMMEDIATE MEDICAL CARE IF:   You have blood or brown flecks (like coffee grounds) in your vomit.  You have black or bloody stools.  You have a severe headache or stiff neck.  You are confused.  You have severe abdominal pain.  You have chest pain or trouble breathing.  You do not urinate at least once every 8 hours.  You develop cold or clammy skin.  You continue to vomit for longer than 24 to 48 hours.  You have a fever. MAKE SURE YOU:   Understand these instructions.  Will watch your condition.  Will get help right away if you are not doing well or get worse. Document Released: 10/13/2005 Document Revised: 01/05/2012 Document Reviewed: 03/12/2011 [facility] Patient Information 2015 [place], [place]. This information is not intended to replace advice given to you by your health care provider. Make sure you discuss any questions you have with your health care provider.

## 2014-07-30 NOTE — MAU Note (Signed)
Pt states here for eval of possible food poisoning. Ate hot dog, fresh veggies around 1800, then around 0100 this am began vomiting. Denies diarrhea. Vomited x2 total. Denies bleeding or vag d/c changes.

## 2014-08-01 LAB — CULTURE, OB URINE
CULTURE: NO GROWTH
Colony Count: NO GROWTH

## 2014-08-11 ENCOUNTER — Other Ambulatory Visit: Payer: Self-pay

## 2014-08-28 ENCOUNTER — Encounter (HOSPITAL_COMMUNITY): Payer: Self-pay

## 2014-09-07 ENCOUNTER — Other Ambulatory Visit (HOSPITAL_COMMUNITY): Payer: Self-pay | Admitting: Obstetrics and Gynecology

## 2014-09-14 ENCOUNTER — Ambulatory Visit (HOSPITAL_COMMUNITY)
Admission: RE | Admit: 2014-09-14 | Discharge: 2014-09-14 | Disposition: A | Discharge status: Home / Self Care | Payer: BC Managed Care – PPO | Source: Ambulatory Visit | Attending: Obstetrics and Gynecology | Admitting: Obstetrics and Gynecology

## 2014-09-14 ENCOUNTER — Encounter (HOSPITAL_COMMUNITY): Payer: Self-pay

## 2014-09-14 ENCOUNTER — Other Ambulatory Visit (HOSPITAL_COMMUNITY): Payer: Self-pay

## 2014-09-14 DIAGNOSIS — Z36 Encounter for antenatal screening of mother: Secondary | ICD-10-CM | POA: Insufficient documentation

## 2014-09-14 DIAGNOSIS — O09899 Supervision of other high risk pregnancies, unspecified trimester: Secondary | ICD-10-CM | POA: Insufficient documentation

## 2014-09-14 DIAGNOSIS — Z3A19 19 weeks gestation of pregnancy: Secondary | ICD-10-CM | POA: Diagnosis not present

## 2014-09-14 DIAGNOSIS — O99320 Drug use complicating pregnancy, unspecified trimester: Secondary | ICD-10-CM | POA: Insufficient documentation

## 2014-09-14 DIAGNOSIS — Z3689 Encounter for other specified antenatal screening: Secondary | ICD-10-CM | POA: Insufficient documentation

## 2014-10-27 NOTE — L&D Delivery Note (Signed)
Delivery Note At 4:47 PM a viable female was delivered via Vaginal, Spontaneous Delivery (Presentation: Left Occiput Anterior).  APGAR:8/9 , ; weight  .   Placenta status: Intact, Spontaneous.3 vessel  Cord:  with the following complications: None.  Cord pH: na  Anesthesia: epidurql Episiotomy:  none Lacerations:  none Suture Repair: na Est. Blood Loss (mL):  350  Mom to postpartum.  Baby to Couplet care / Skin to Skin.  [name] S 01/31/2015, 4:56 PM

## 2015-01-26 LAB — OB RESULTS CONSOLE GBS: GBS: NEGATIVE

## 2015-01-31 ENCOUNTER — Inpatient Hospital Stay (HOSPITAL_COMMUNITY): Payer: BLUE CROSS/BLUE SHIELD | Admitting: Anesthesiology

## 2015-01-31 ENCOUNTER — Inpatient Hospital Stay (HOSPITAL_COMMUNITY)
Admission: AD | Admit: 2015-01-31 | Discharge: 2015-02-02 | DRG: 775 | Disposition: A | Discharge status: Home / Self Care | Payer: BLUE CROSS/BLUE SHIELD | Source: Ambulatory Visit | Attending: Obstetrics and Gynecology | Admitting: Obstetrics and Gynecology

## 2015-01-31 ENCOUNTER — Encounter (HOSPITAL_COMMUNITY): Payer: Self-pay | Admitting: *Deleted

## 2015-01-31 DIAGNOSIS — Z3A39 39 weeks gestation of pregnancy: Secondary | ICD-10-CM | POA: Diagnosis present

## 2015-01-31 DIAGNOSIS — Z8249 Family history of ischemic heart disease and other diseases of the circulatory system: Secondary | ICD-10-CM

## 2015-01-31 DIAGNOSIS — Z833 Family history of diabetes mellitus: Secondary | ICD-10-CM

## 2015-01-31 DIAGNOSIS — Z820 Family history of epilepsy and other diseases of the nervous system: Secondary | ICD-10-CM

## 2015-01-31 DIAGNOSIS — IMO0001 Reserved for inherently not codable concepts without codable children: Secondary | ICD-10-CM

## 2015-01-31 DIAGNOSIS — Z823 Family history of stroke: Secondary | ICD-10-CM | POA: Diagnosis not present

## 2015-01-31 DIAGNOSIS — O9989 Other specified diseases and conditions complicating pregnancy, childbirth and the puerperium: Secondary | ICD-10-CM | POA: Diagnosis present

## 2015-01-31 HISTORY — DX: Other specified health status: Z78.9

## 2015-01-31 LAB — CBC
HCT: 38.7 % (ref 36.0–46.0)
Hemoglobin: 13.3 g/dL (ref 12.0–15.0)
MCH: 33.3 pg (ref 26.0–34.0)
MCHC: 34.4 g/dL (ref 30.0–36.0)
MCV: 96.8 fL (ref 78.0–100.0)
Platelets: 220 10*3/uL (ref 150–400)
RBC: 4 MIL/uL (ref 3.87–5.11)
RDW: 13 % (ref 11.5–15.5)
WBC: 11 10*3/uL — AB (ref 4.0–10.5)

## 2015-01-31 LAB — TYPE AND SCREEN
ABO/RH(D): A POS
Antibody Screen: NEGATIVE

## 2015-01-31 LAB — ABO/RH: ABO/RH(D): A POS

## 2015-01-31 LAB — RPR: RPR Ser Ql: NONREACTIVE

## 2015-01-31 MED ORDER — IBUPROFEN 600 MG PO TABS
600.0000 mg | ORAL_TABLET | Freq: Four times a day (QID) | ORAL | Status: DC
  Administered 2015-01-31 – 2015-02-02 (×6): 600 mg via ORAL
  Filled 2015-01-31 (×6): qty 1

## 2015-01-31 MED ORDER — OXYTOCIN 40 UNITS IN LACTATED RINGERS INFUSION - SIMPLE MED
1.0000 m[IU]/min | INTRAVENOUS | Status: DC
  Administered 2015-01-31: 2 m[IU]/min via INTRAVENOUS
  Filled 2015-01-31: qty 1000

## 2015-01-31 MED ORDER — DIPHENHYDRAMINE HCL 50 MG/ML IJ SOLN: 12.5000 mg | INTRAMUSCULAR | Status: DC | PRN

## 2015-01-31 MED ORDER — EPHEDRINE 5 MG/ML INJ: 10.0000 mg | INTRAVENOUS | Status: DC | PRN

## 2015-01-31 MED ORDER — SENNOSIDES-DOCUSATE SODIUM 8.6-50 MG PO TABS
2.0000 | ORAL_TABLET | ORAL | Status: DC
  Administered 2015-01-31 – 2015-02-01 (×2): 2 via ORAL
  Filled 2015-01-31 (×2): qty 2

## 2015-01-31 MED ORDER — OXYCODONE-ACETAMINOPHEN 5-325 MG PO TABS: 2.0000 | ORAL_TABLET | ORAL | Status: DC | PRN

## 2015-01-31 MED ORDER — EPHEDRINE 5 MG/ML INJ
10.0000 mg | INTRAVENOUS | Status: DC | PRN
  Filled 2015-01-31: qty 2

## 2015-01-31 MED ORDER — LIDOCAINE HCL (PF) 1 % IJ SOLN
INTRAMUSCULAR | Status: DC | PRN
  Administered 2015-01-31: 6 mL
  Administered 2015-01-31: 4 mL

## 2015-01-31 MED ORDER — ONDANSETRON HCL 4 MG PO TABS: 4.0000 mg | ORAL_TABLET | ORAL | Status: DC | PRN

## 2015-01-31 MED ORDER — LACTATED RINGERS IV SOLN: 500.0000 mL | INTRAVENOUS | Status: DC | PRN

## 2015-01-31 MED ORDER — PHENYLEPHRINE 40 MCG/ML (10ML) SYRINGE FOR IV PUSH (FOR BLOOD PRESSURE SUPPORT): 80.0000 ug | PREFILLED_SYRINGE | INTRAVENOUS | Status: DC | PRN

## 2015-01-31 MED ORDER — BISACODYL 10 MG RE SUPP: 10.0000 mg | Freq: Every day | RECTAL | Status: DC | PRN

## 2015-01-31 MED ORDER — FENTANYL 2.5 MCG/ML BUPIVACAINE 1/10 % EPIDURAL INFUSION (WH - ANES)
INTRAMUSCULAR | Status: AC
  Administered 2015-01-31: 14 mL/h via EPIDURAL
  Filled 2015-01-31: qty 125

## 2015-01-31 MED ORDER — FENTANYL 2.5 MCG/ML BUPIVACAINE 1/10 % EPIDURAL INFUSION (WH - ANES)
14.0000 mL/h | INTRAMUSCULAR | Status: DC | PRN
  Administered 2015-01-31: 14 mL/h via EPIDURAL

## 2015-01-31 MED ORDER — LACTATED RINGERS IV SOLN
500.0000 mL | Freq: Once | INTRAVENOUS | Status: AC
  Administered 2015-01-31: 500 mL via INTRAVENOUS

## 2015-01-31 MED ORDER — ACETAMINOPHEN 325 MG PO TABS
650.0000 mg | ORAL_TABLET | ORAL | Status: DC | PRN
Start: 2015-01-31 — End: 2015-02-02

## 2015-01-31 MED ORDER — FLEET ENEMA 7-19 GM/118ML RE ENEM: 1.0000 | ENEMA | Freq: Every day | RECTAL | Status: DC | PRN

## 2015-01-31 MED ORDER — FLEET ENEMA 7-19 GM/118ML RE ENEM: 1.0000 | ENEMA | RECTAL | Status: DC | PRN

## 2015-01-31 MED ORDER — ZOLPIDEM TARTRATE 5 MG PO TABS: 5.0000 mg | ORAL_TABLET | Freq: Every evening | ORAL | Status: DC | PRN

## 2015-01-31 MED ORDER — OXYTOCIN 40 UNITS IN LACTATED RINGERS INFUSION - SIMPLE MED: 62.5000 mL/h | INTRAVENOUS | Status: DC

## 2015-01-31 MED ORDER — OXYTOCIN BOLUS FROM INFUSION
500.0000 mL | INTRAVENOUS | Status: DC
  Administered 2015-01-31: 500 mL via INTRAVENOUS

## 2015-01-31 MED ORDER — LACTATED RINGERS IV SOLN
INTRAVENOUS | Status: DC
  Administered 2015-01-31 (×2): via INTRAVENOUS

## 2015-01-31 MED ORDER — DIBUCAINE 1 % RE OINT: 1.0000 "application " | TOPICAL_OINTMENT | RECTAL | Status: DC | PRN

## 2015-01-31 MED ORDER — PHENYLEPHRINE 40 MCG/ML (10ML) SYRINGE FOR IV PUSH (FOR BLOOD PRESSURE SUPPORT)
PREFILLED_SYRINGE | INTRAVENOUS | Status: AC
  Filled 2015-01-31: qty 20

## 2015-01-31 MED ORDER — FENTANYL 2.5 MCG/ML BUPIVACAINE 1/10 % EPIDURAL INFUSION (WH - ANES): 14.0000 mL/h | INTRAMUSCULAR | Status: DC | PRN

## 2015-01-31 MED ORDER — LANOLIN HYDROUS EX OINT
TOPICAL_OINTMENT | CUTANEOUS | Status: DC | PRN
Start: 2015-01-31 — End: 2015-02-02

## 2015-01-31 MED ORDER — PHENYLEPHRINE 40 MCG/ML (10ML) SYRINGE FOR IV PUSH (FOR BLOOD PRESSURE SUPPORT)
80.0000 ug | PREFILLED_SYRINGE | INTRAVENOUS | Status: DC | PRN
  Filled 2015-01-31: qty 2

## 2015-01-31 MED ORDER — PRENATAL MULTIVITAMIN CH
1.0000 | ORAL_TABLET | Freq: Every day | ORAL | Status: DC
  Administered 2015-02-01: 1 via ORAL
  Filled 2015-01-31: qty 1

## 2015-01-31 MED ORDER — ACETAMINOPHEN 325 MG PO TABS: 650.0000 mg | ORAL_TABLET | ORAL | Status: DC | PRN

## 2015-01-31 MED ORDER — LACTATED RINGERS IV SOLN: 500.0000 mL | Freq: Once | INTRAVENOUS | Status: DC

## 2015-01-31 MED ORDER — BENZOCAINE-MENTHOL 20-0.5 % EX AERO
1.0000 "application " | INHALATION_SPRAY | CUTANEOUS | Status: DC | PRN
  Administered 2015-01-31: 1 via TOPICAL
  Filled 2015-01-31: qty 56

## 2015-01-31 MED ORDER — WITCH HAZEL-GLYCERIN EX PADS: 1.0000 "application " | MEDICATED_PAD | CUTANEOUS | Status: DC | PRN

## 2015-01-31 MED ORDER — CITRIC ACID-SODIUM CITRATE 334-500 MG/5ML PO SOLN: 30.0000 mL | ORAL | Status: DC | PRN

## 2015-01-31 MED ORDER — ONDANSETRON HCL 4 MG/2ML IJ SOLN: 4.0000 mg | Freq: Four times a day (QID) | INTRAMUSCULAR | Status: DC | PRN

## 2015-01-31 MED ORDER — OXYCODONE-ACETAMINOPHEN 5-325 MG PO TABS: 1.0000 | ORAL_TABLET | ORAL | Status: DC | PRN

## 2015-01-31 MED ORDER — SIMETHICONE 80 MG PO CHEW: 80.0000 mg | CHEWABLE_TABLET | ORAL | Status: DC | PRN

## 2015-01-31 MED ORDER — TERBUTALINE SULFATE 1 MG/ML IJ SOLN
0.2500 mg | Freq: Once | INTRAMUSCULAR | Status: DC | PRN
  Filled 2015-01-31: qty 1

## 2015-01-31 MED ORDER — LIDOCAINE HCL (PF) 1 % IJ SOLN
30.0000 mL | INTRAMUSCULAR | Status: DC | PRN
  Filled 2015-01-31: qty 30

## 2015-01-31 MED ORDER — ONDANSETRON HCL 4 MG/2ML IJ SOLN
4.0000 mg | INTRAMUSCULAR | Status: DC | PRN
Start: 2015-01-31 — End: 2015-02-02

## 2015-01-31 MED ORDER — DIPHENHYDRAMINE HCL 25 MG PO CAPS: 25.0000 mg | ORAL_CAPSULE | Freq: Four times a day (QID) | ORAL | Status: DC | PRN

## 2015-01-31 MED ORDER — TETANUS-DIPHTH-ACELL PERTUSSIS 5-2.5-18.5 LF-MCG/0.5 IM SUSP: 0.5000 mL | Freq: Once | INTRAMUSCULAR | Status: DC

## 2015-01-31 NOTE — MAU Note (Signed)
Having some contractions, unaware of how often. No bleeding or leaking.  Was 4 cm  2 Monday's ago.

## 2015-01-31 NOTE — Anesthesia Procedure Notes (Signed)
Epidural Patient location during procedure: OB  Preanesthetic Checklist Completed: patient identified, site marked, surgical consent, pre-op evaluation, timeout performed, IV checked, risks and benefits discussed and monitors and equipment checked  Epidural Patient position: sitting Prep: site prepped and draped and DuraPrep Patient monitoring: continuous pulse ox and blood pressure Approach: midline Location: L3-L4 Injection technique: LOR air  Needle:  Needle type: Tuohy  Needle gauge: 17 G Needle length: 9 cm and 9 Needle insertion depth: 4 cm Catheter type: closed end flexible Catheter size: 19 Gauge Catheter at skin depth: 9 cm Test dose: negative  Assessment Events: blood not aspirated, injection not painful, no injection resistance, negative IV test and no paresthesia

## 2015-01-31 NOTE — H&P (Signed)
[name] is a 34 y.o. female presenting for IOL at 39weeks with favorable cervix.  Negative GBS.  Early in pregnancy took prozac for two days sono with MFM ok.  Cell free dna test normal. Maternal Medical History:  Fetal activity: Perceived fetal activity is normal.    Prenatal complications: no prenatal complications Prenatal Complications - Diabetes: none.    OB History    Gravida Para Term Preterm AB TAB SAB Ectopic Multiple Living   2 1 1       1      Past Medical History  Diagnosis Date  . No pertinent past medical history   . H/O varicella   . PONV (postoperative nausea and vomiting)   . Medical history non-contributory    Past Surgical History  Procedure Laterality Date  . Knee surgery     Family History: family history includes Arthritis in her paternal grandmother; COPD in her maternal grandfather; Diabetes in her maternal uncle; Heart disease in her paternal grandfather; Hypertension in her father; Other in her mother; Parkinsonism in her paternal grandfather; Stroke in her maternal grandmother; Thyroid disease in her maternal grandfather. Social History:  reports that she has never smoked. She has never used smokeless tobacco. She reports that she does not drink alcohol or use illicit drugs.   Prenatal Transfer Tool  Maternal Diabetes: No Genetic Screening: Normal Maternal Ultrasounds/Referrals: Normal Fetal Ultrasounds or other Referrals:  None Maternal Substance Abuse:  No Significant Maternal Medications:  None Significant Maternal Lab Results:  None Other Comments:  None  ROS  Dilation: 4 Effacement (%): 80 Station: -1 Exam by:: Dr. Arelia SneddonMcComb Blood pressure 116/79, pulse 76, temperature 96.9 F (36.1 C), temperature source Oral, resp. rate 20, height 5\' 4"  (1.626 m), weight 142 lb (64.411 kg), last menstrual period 05/03/2014. Maternal Exam:  Uterine Assessment: Contraction strength is mild.  Contraction frequency is irregular.   Abdomen: Patient  reports no abdominal tenderness. Fundal height is c/w dates.   Fetal presentation: vertex  Introitus: Amniotic fluid character: clear.  Pelvis: adequate for delivery.   Cervix: 4 cm and 80 % vertex at -1  Fetal Exam Fetal State Assessment: Category I - tracings are normal.     Physical Exam  Prenatal labs: ABO, Rh:   Antibody:   Rubella:   RPR: Nonreactive (09/04 0000)  HBsAg:    HIV: Non-reactive (09/04 0000)  GBS: Negative (04/01 0000)   Assessment/Plan: iup at 39 weeks with favorable cervix AROM   [name] S 01/31/2015, 8:55 AM

## 2015-01-31 NOTE — MAU Note (Signed)
C/o ucs for days; denies any vaginal leaking;

## 2015-01-31 NOTE — Anesthesia Preprocedure Evaluation (Signed)

## 2015-02-01 LAB — CBC
HCT: 31.6 % — ABNORMAL LOW (ref 36.0–46.0)
Hemoglobin: 11.1 g/dL — ABNORMAL LOW (ref 12.0–15.0)
MCH: 33.8 pg (ref 26.0–34.0)
MCHC: 35.1 g/dL (ref 30.0–36.0)
MCV: 96.3 fL (ref 78.0–100.0)
Platelets: 192 10*3/uL (ref 150–400)
RBC: 3.28 MIL/uL — ABNORMAL LOW (ref 3.87–5.11)
RDW: 13 % (ref 11.5–15.5)
WBC: 15 10*3/uL — AB (ref 4.0–10.5)

## 2015-02-01 NOTE — Progress Notes (Signed)
Post Partum Day 1 Subjective: no complaints, up ad lib, voiding and tolerating PO  Objective: Blood pressure 113/59, pulse 88, temperature 98.5 F (36.9 C), temperature source Oral, resp. rate 18, height 5\' 4"  (1.626 m), weight 142 lb (64.411 kg), last menstrual period 05/03/2014, SpO2 98 %, unknown if currently breastfeeding.  Physical Exam:  General: alert and cooperative Lochia: appropriate Uterine Fundus: firm Incision: healing well DVT Evaluation: No evidence of DVT seen on physical exam. Negative Homan's sign. No cords or calf tenderness. No significant calf/ankle edema.   Recent Labs  01/31/15 0820 02/01/15 0550  HGB 13.3 11.1*  HCT 38.7 31.6*    Assessment/Plan: Plan for discharge tomorrow and Circumcision prior to discharge   LOS: 1 day   Kellye Mizner G 02/01/2015, 8:29 AM

## 2015-02-01 NOTE — Anesthesia Postprocedure Evaluation (Signed)
Anesthesia Post Note  Patient: [name]  Procedure(s) Performed: * No procedures listed *  Anesthesia type: Epidural  Patient location: Mother/Baby  Post pain: Pain level controlled  Post assessment: Post-op Vital signs reviewed  Last Vitals:  Filed Vitals:   01/31/15 2325  BP: 113/59  Pulse: 88  Temp: 36.9 C  Resp: 18    Post vital signs: Reviewed  Level of consciousness: awake  Complications: No apparent anesthesia complications

## 2015-02-01 NOTE — Lactation Note (Signed)
This note was copied from the chart of [name]. Lactation Consultation Note  Mom's first baby did not latch so she pumped and bottle fed BM to her.  Mom reports that this baby,Luther, is feeding better but that he is sleepy post circumcision. We woke him and placed his upper body skin to skin with his mom.  He held the nipple in his mouth but did not feed.  I mom to call for a feeding assessment when he is awake and latched. She agreed to do this.  I also spoke to her about normal newborn behavior the second night of life. I explained to her that if necessary [name] could be spoon fed expressed colostrum. Patient Name: Darlene Edmund HildaCrystal Lovings Today's Date: 02/01/2015 Reason for consult: Initial assessment   Maternal Data Has patient been taught Hand Expression?: Yes  Feeding Feeding Type: Breast Fed  LATCH Score/Interventions Latch: Repeated attempts needed to sustain latch, nipple held in mouth throughout feeding, stimulation needed to elicit sucking reflex.  Audible Swallowing: None  Type of Nipple: Everted at rest and after stimulation  Comfort (Breast/Nipple): Soft / non-tender     Hold (Positioning): No assistance needed to correctly position infant at breast.  LATCH Score: 7  Lactation Tools Discussed/Used Date initiated:: 02/02/15   Consult Status Consult Status: Follow-up Date: 02/02/15 Follow-up type: In-patient    Soyla DryerJoseph, Duke Weisensel 02/01/2015, 3:17 PM

## 2015-02-02 MED ORDER — IBUPROFEN 600 MG PO TABS: 600.0000 mg | ORAL_TABLET | Freq: Four times a day (QID) | ORAL | Status: DC

## 2015-02-02 NOTE — Discharge Summary (Signed)
Obstetric Discharge Summary Reason for Admission: induction of labor Prenatal Procedures: ultrasound Intrapartum Procedures: spontaneous vaginal delivery Postpartum Procedures: none Complications-Operative and Postpartum: none HEMOGLOBIN  Date Value Ref Range Status  02/01/2015 11.1* 12.0 - 15.0 g/dL Final   HCT  Date Value Ref Range Status  02/01/2015 31.6* 36.0 - 46.0 % Final    Physical Exam:  General: alert and cooperative Lochia: appropriate Uterine Fundus: firm Incision: perineum intact DVT Evaluation: No evidence of DVT seen on physical exam. Negative Homan's sign. No cords or calf tenderness. No significant calf/ankle edema.  Discharge Diagnoses: Term Pregnancy-delivered  Discharge Information: Date: 02/02/2015 Activity: pelvic rest Diet: routine Medications: PNV and Ibuprofen Condition: stable Instructions: refer to practice specific booklet Discharge to: home   Newborn Data: Live born female  Birth Weight: 6 lb 11.1 oz (3035 g) APGAR: 9, 10  Home with mother.  Janette Harvie G 02/02/2015, 8:32 AM

## 2015-02-03 ENCOUNTER — Encounter (HOSPITAL_COMMUNITY): Payer: Self-pay

## 2015-02-03 ENCOUNTER — Inpatient Hospital Stay (HOSPITAL_COMMUNITY)
Admission: AD | Admit: 2015-02-03 | Discharge: 2015-02-03 | Disposition: A | Discharge status: Home / Self Care | Payer: BLUE CROSS/BLUE SHIELD | Source: Ambulatory Visit | Attending: Obstetrics and Gynecology | Admitting: Obstetrics and Gynecology

## 2015-02-03 DIAGNOSIS — R519 Headache, unspecified: Secondary | ICD-10-CM

## 2015-02-03 DIAGNOSIS — R51 Headache: Secondary | ICD-10-CM

## 2015-02-03 DIAGNOSIS — O9089 Other complications of the puerperium, not elsewhere classified: Secondary | ICD-10-CM | POA: Insufficient documentation

## 2015-02-03 LAB — URINALYSIS, ROUTINE W REFLEX MICROSCOPIC
Bilirubin Urine: NEGATIVE
Glucose, UA: NEGATIVE mg/dL
Ketones, ur: NEGATIVE mg/dL
Leukocytes, UA: NEGATIVE
Nitrite: NEGATIVE
Protein, ur: NEGATIVE mg/dL
Specific Gravity, Urine: >1.03 — ABNORMAL HIGH (ref 1.005–1.030)
Urobilinogen, UA: 0.2 mg/dL (ref 0.0–1.0)
pH: 5.5 (ref 5.0–8.0)

## 2015-02-03 LAB — URINE MICROSCOPIC-ADD ON

## 2015-02-03 MED ORDER — CYCLOBENZAPRINE HCL 10 MG PO TABS
10.0000 mg | ORAL_TABLET | Freq: Once | ORAL | Status: AC
  Administered 2015-02-03: 10 mg via ORAL
  Filled 2015-02-03: qty 1

## 2015-02-03 MED ORDER — CYCLOBENZAPRINE HCL 10 MG PO TABS: 10.0000 mg | ORAL_TABLET | Freq: Two times a day (BID) | ORAL | Status: DC | PRN

## 2015-02-03 MED ORDER — OXYCODONE-ACETAMINOPHEN 5-325 MG PO TABS: 1.0000 | ORAL_TABLET | ORAL | Status: DC | PRN

## 2015-02-03 MED ORDER — OXYCODONE-ACETAMINOPHEN 5-325 MG PO TABS
1.0000 | ORAL_TABLET | Freq: Once | ORAL | Status: AC
  Administered 2015-02-03: 1 via ORAL
  Filled 2015-02-03: qty 1

## 2015-02-03 NOTE — MAU Note (Signed)
Pain back of neck and radiates up to head and toward front of head. Started right after delivery and has progressively gotten worse. SVD on Weds. Did have epidural. Pain continues whether lying or standing.

## 2015-02-03 NOTE — MAU Provider Note (Signed)
History     CSN: [id number]  Arrival date and time: 02/03/15 1821   First Provider Initiated Contact with Patient 02/03/15 2106      Chief Complaint  Patient presents with  . Headache  . Neck Pain   HPI 34 y.o. G2P2002 at 3 days s/p NSVD w/ headache. Soreness in neck started immediately after delivery, progressively worsened. Pt states pain starts in neck bilaterally, radiates to front of head/temple area. No vision changes. Headache waxes and wanes, sometimes the worst headache she's ever had, other times decreases. Headache does not change w/ position or head movements. Some nausea when the pain is at its worst, but no vomiting. No history of headache or migraine.   Past Medical History  Diagnosis Date  . No pertinent past medical history   . H/O varicella   . PONV (postoperative nausea and vomiting)   . Medical history non-contributory     Past Surgical History  Procedure Laterality Date  . Knee surgery      Family History  Problem Relation Age of Onset  . Other Mother     hemorrage post RVD requiring hysterectomy  . Hypertension Father   . Diabetes Maternal Uncle   . Stroke Maternal Grandmother   . Thyroid disease Maternal Grandfather   . COPD Maternal Grandfather   . Heart disease Paternal Grandfather   . Parkinsonism Paternal Grandfather   . Arthritis Paternal Grandmother     History  Substance Use Topics  . Smoking status: Never Smoker   . Smokeless tobacco: Never Used  . Alcohol Use: No    Allergies: No Known Allergies  Prescriptions prior to admission  Medication Sig Dispense Refill Last Dose  . ibuprofen (ADVIL,MOTRIN) 600 MG tablet Take 1 tablet (600 mg total) by mouth every 6 (six) hours. (Patient taking differently: Take 600 mg by mouth every 6 (six) hours as needed for moderate pain. ) 30 tablet 1 02/03/2015 at 1730  . Prenatal Vit-Fe Fumarate-FA (PRENATAL MULTIVITAMIN) TABS Take 1 tablet by mouth daily.   02/02/2015 at Unknown time    Review of  Systems  Constitutional: Negative for fever, chills and malaise/fatigue.  Eyes: Negative for blurred vision, double vision, photophobia and pain.  Respiratory: Negative for shortness of breath.   Cardiovascular: Negative for chest pain.  Gastrointestinal: Positive for nausea. Negative for vomiting and abdominal pain.  Musculoskeletal: Positive for neck pain.  Neurological: Positive for headaches. Negative for dizziness, tingling, sensory change, speech change, focal weakness, seizures, loss of consciousness and weakness.   Physical Exam   Blood pressure 119/81, pulse 72, temperature 97.8 F (36.6 C), resp. rate 16, height  (1.626 m), weight 130 lb 9.6 oz (59.24 kg), unknown if currently breastfeeding.  Physical Exam  Nursing note and vitals reviewed. Constitutional: She is oriented to person, place, and time. She appears well-developed and well-nourished. No distress.  HENT:  Head: Normocephalic and atraumatic.  Neck: Normal range of motion. Neck supple.  Cardiovascular: Normal rate.   Respiratory: Effort normal.  Musculoskeletal: Normal range of motion. She exhibits tenderness (bilateral posterior neck).  Neurological: She is alert and oriented to person, place, and time. No cranial nerve deficit. Coordination normal.  Skin: Skin is warm and dry.  Psychiatric: She has a normal mood and affect.    MAU Course  Procedures  Results for orders placed or performed during the hospital encounter of 02/03/15 (from the past 24 hour(s))  Urinalysis, Routine w reflex microscopic     Status: Abnormal  Collection Time: 02/03/15  7:21 PM  Result Value Ref Range   Color, Urine YELLOW YELLOW   APPearance HAZY (A) CLEAR   Specific Gravity, Urine >1.030 (H) 1.005 - 1.030   pH 5.5 5.0 - 8.0   Glucose, UA NEGATIVE NEGATIVE mg/dL   Hgb urine dipstick LARGE (A) NEGATIVE   Bilirubin Urine NEGATIVE NEGATIVE   Ketones, ur NEGATIVE NEGATIVE mg/dL   Protein, ur NEGATIVE NEGATIVE mg/dL    Urobilinogen, UA 0.2 0.0 - 1.0 mg/dL   Nitrite NEGATIVE NEGATIVE   Leukocytes, UA NEGATIVE NEGATIVE  Urine microscopic-add on     Status: Abnormal   Collection Time: 02/03/15  7:21 PM  Result Value Ref Range   Squamous Epithelial / LPF FEW (A) RARE   WBC, UA 0-2 <3 WBC/hpf   RBC / HPF 21-50 <3 RBC/hpf   Bacteria, UA RARE RARE     Assessment and Plan   1. Postpartum headache   Appears musculoskeletal in origin, likely related to neck strain during labor and aggravated by breastfeeding position, pt is stable and comfortable throughout MAU stay Percocet and Flexeril given in MAU, rx provided for home Rev'd comfort measures, heat/ice to neck, discussed good body mechanics for nursing Return to MAU w/ worsening symptoms, onset of vomiting, vision changes or other concerning symptoms Rev'd w/ Dr. Henderson Cloudomblin, agrees w/ plan    Medication List    TAKE these medications        cyclobenzaprine 10 MG tablet  Commonly known as:  FLEXERIL  Take 1 tablet (10 mg total) by mouth 2 (two) times daily as needed for muscle spasms.     ibuprofen 600 MG tablet  Commonly known as:  ADVIL,MOTRIN  Take 1 tablet (600 mg total) by mouth every 6 (six) hours.     oxyCODONE-acetaminophen 5-325 MG per tablet  Commonly known as:  PERCOCET/ROXICET  Take 1 tablet by mouth every 4 (four) hours as needed for severe pain.     prenatal multivitamin Tabs tablet  Take 1 tablet by mouth daily.            Follow-up Information    Follow up with TOMBLIN Cristie HemII,JAMES E, MD.   Specialty:  Obstetrics and Gynecology   Why:  as scheduled or sooner as needed   Contact information:   [address] [place] [id number] [phone]         [facility] Center[facility]RAZIER,NATALIE 02/03/2015, 9:56 PM

## 2015-03-12 ENCOUNTER — Other Ambulatory Visit: Payer: Self-pay | Admitting: Obstetrics and Gynecology

## 2015-03-13 LAB — CYTOLOGY - PAP

## 2016-03-04 ENCOUNTER — Encounter (HOSPITAL_COMMUNITY): Payer: Self-pay | Admitting: Emergency Medicine

## 2016-03-04 ENCOUNTER — Emergency Department (HOSPITAL_COMMUNITY)
Admission: EM | Admit: 2016-03-04 | Discharge: 2016-03-04 | Disposition: A | Discharge status: Home / Self Care | Payer: 59 | Attending: Emergency Medicine | Admitting: Emergency Medicine

## 2016-03-04 ENCOUNTER — Emergency Department (HOSPITAL_COMMUNITY): Payer: 59

## 2016-03-04 DIAGNOSIS — Z79891 Long term (current) use of opiate analgesic: Secondary | ICD-10-CM | POA: Insufficient documentation

## 2016-03-04 DIAGNOSIS — Z791 Long term (current) use of non-steroidal anti-inflammatories (NSAID): Secondary | ICD-10-CM | POA: Insufficient documentation

## 2016-03-04 DIAGNOSIS — K529 Noninfective gastroenteritis and colitis, unspecified: Secondary | ICD-10-CM | POA: Insufficient documentation

## 2016-03-04 DIAGNOSIS — Z79899 Other long term (current) drug therapy: Secondary | ICD-10-CM | POA: Insufficient documentation

## 2016-03-04 DIAGNOSIS — D72829 Elevated white blood cell count, unspecified: Secondary | ICD-10-CM | POA: Diagnosis present

## 2016-03-04 LAB — URINALYSIS, ROUTINE W REFLEX MICROSCOPIC
GLUCOSE, UA: NEGATIVE mg/dL
Ketones, ur: 40 mg/dL — AB
Leukocytes, UA: NEGATIVE
Nitrite: NEGATIVE
Protein, ur: NEGATIVE mg/dL
Specific Gravity, Urine: 1.025 (ref 1.005–1.030)
pH: 5 (ref 5.0–8.0)

## 2016-03-04 LAB — CBC
HCT: 45.2 % (ref 36.0–46.0)
Hemoglobin: 15.9 g/dL — ABNORMAL HIGH (ref 12.0–15.0)
MCH: 32.3 pg (ref 26.0–34.0)
MCHC: 35.2 g/dL (ref 30.0–36.0)
MCV: 91.9 fL (ref 78.0–100.0)
Platelets: 324 10*3/uL (ref 150–400)
RBC: 4.92 MIL/uL (ref 3.87–5.11)
RDW: 13 % (ref 11.5–15.5)
WBC: 11.8 10*3/uL — ABNORMAL HIGH (ref 4.0–10.5)

## 2016-03-04 LAB — COMPREHENSIVE METABOLIC PANEL
ALBUMIN: 4.5 g/dL (ref 3.5–5.0)
ALK PHOS: 76 U/L (ref 38–126)
ALT: 19 U/L (ref 14–54)
AST: 19 U/L (ref 15–41)
Anion gap: 9 (ref 5–15)
BUN: 12 mg/dL (ref 6–20)
CALCIUM: 8.9 mg/dL (ref 8.9–10.3)
CO2: 23 mmol/L (ref 22–32)
CREATININE: 0.52 mg/dL (ref 0.44–1.00)
Chloride: 107 mmol/L (ref 101–111)
GFR calc Af Amer: >60 mL/min (ref >60)
GFR calc non Af Amer: >60 mL/min (ref >60)
GLUCOSE: 107 mg/dL — AB (ref 65–99)
Potassium: 4.1 mmol/L (ref 3.5–5.1)
SODIUM: 139 mmol/L (ref 135–145)
Total Bilirubin: 1 mg/dL (ref 0.3–1.2)
Total Protein: 7.4 g/dL (ref 6.5–8.1)

## 2016-03-04 LAB — I-STAT CG4 LACTIC ACID, ED: Lactic Acid, Venous: 0.97 mmol/L (ref 0.5–2.0)

## 2016-03-04 LAB — LIPASE, BLOOD: Lipase: 24 U/L (ref 11–51)

## 2016-03-04 LAB — URINE MICROSCOPIC-ADD ON

## 2016-03-04 LAB — I-STAT BETA HCG BLOOD, ED (MC, WL, AP ONLY)

## 2016-03-04 MED ORDER — ONDANSETRON HCL 4 MG/2ML IJ SOLN
4.0000 mg | Freq: Once | INTRAMUSCULAR | Status: AC
  Administered 2016-03-04: 4 mg via INTRAVENOUS
  Filled 2016-03-04: qty 2

## 2016-03-04 MED ORDER — DICYCLOMINE HCL 20 MG PO TABS: ORAL_TABLET | ORAL | Status: DC

## 2016-03-04 MED ORDER — SODIUM CHLORIDE 0.9 % IV BOLUS (SEPSIS)
2000.0000 mL | Freq: Once | INTRAVENOUS | Status: AC
  Administered 2016-03-04: 2000 mL via INTRAVENOUS

## 2016-03-04 MED ORDER — ONDANSETRON 4 MG PO TBDP: ORAL_TABLET | ORAL | Status: DC

## 2016-03-04 NOTE — Discharge Instructions (Signed)
Follow up with [facility] in 1-2 weeks.  Drink plenty of fluids

## 2016-03-04 NOTE — ED Provider Notes (Signed)
CSN: [id number]     Arrival date & time 03/04/16  1544 History   First MD Initiated Contact with Patient 03/04/16 1700     Chief Complaint  Patient presents with  . Elevated WBC      (Consider location/radiation/quality/duration/timing/severity/associated sxs/prior Treatment) Patient is a 35 y.o. female presenting with abdominal pain. The history is provided by the patient (The patient states for 3 weeks she's been having abdominal cramps and diarrhea off-and-on. She was seen in urgent care today and was sent over here).  Abdominal Pain Pain location:  Generalized Pain quality: aching   Pain radiates to:  Does not radiate Pain severity:  Mild Onset quality:  Gradual Timing:  Intermittent Progression:  Waxing and waning Chronicity:  Recurrent Context: not alcohol use   Associated symptoms: diarrhea and nausea   Associated symptoms: no chest pain, no cough, no fatigue and no hematuria     Past Medical History  Diagnosis Date  . No pertinent past medical history   . H/O varicella   . PONV (postoperative nausea and vomiting)   . Medical history non-contributory    Past Surgical History  Procedure Laterality Date  . Knee surgery     Family History  Problem Relation Age of Onset  . Other Mother     hemorrage post RVD requiring hysterectomy  . Hypertension Father   . Diabetes Maternal Uncle   . Stroke Maternal Grandmother   . Thyroid disease Maternal Grandfather   . COPD Maternal Grandfather   . Heart disease Paternal Grandfather   . Parkinsonism Paternal Grandfather   . Arthritis Paternal Grandmother    Social History  Substance Use Topics  . Smoking status: Never Smoker   . Smokeless tobacco: Never Used  . Alcohol Use: No   OB History    Gravida Para Term Preterm AB TAB SAB Ectopic Multiple Living   0 2     Review of Systems  Constitutional: Negative for appetite change and fatigue.  HENT: Negative for congestion, ear discharge and sinus pressure.    Eyes: Negative for discharge.  Respiratory: Negative for cough.   Cardiovascular: Negative for chest pain.  Gastrointestinal: Positive for nausea, abdominal pain and diarrhea.  Genitourinary: Negative for frequency and hematuria.  Musculoskeletal: Negative for back pain.  Skin: Negative for rash.  Neurological: Negative for seizures and headaches.  Psychiatric/Behavioral: Negative for hallucinations.      Allergies  Review of patient's allergies indicates no known allergies.  Home Medications   Prior to Admission medications   Medication Sig Start Date End Date Taking? Authorizing Provider  cholecalciferol (VITAMIN D) 1000 units tablet Take 1,000 Units by mouth daily.   Yes Historical Provider, MD  Multiple Vitamins-Minerals (MULTIVITAMIN & MINERAL PO) Take 1 tablet by mouth daily.   Yes Historical Provider, MD  cyclobenzaprine (FLEXERIL) 10 MG tablet Take 1 tablet (10 mg total) by mouth 2 (two) times daily as needed for muscle spasms. Patient not taking: Reported on 03/04/2016 02/03/15   Archie Patten, CNM  dicyclomine (BENTYL) 20 MG tablet Take one every 6-8 hours for abdominal cramping 03/04/16   Bethann Berkshire, MD  ibuprofen (ADVIL,MOTRIN) 600 MG tablet Take 1 tablet (600 mg total) by mouth every 6 (six) hours. Patient not taking: Reported on 03/04/2016 02/02/15   Julio Sicks, NP  ondansetron (ZOFRAN ODT) 4 MG disintegrating tablet  ODT q4 hours prn nausea/vomit 03/04/16   Bethann Berkshire, MD  oxyCODONE-acetaminophen (PERCOCET/ROXICET) 5-325 MG per  tablet Take 1 tablet by mouth every 4 (four) hours as needed for severe pain. Patient not taking: Reported on 03/04/2016 02/03/15   Archie PattenNatalie K Frazier, CNM   BP 105/72 mmHg  Pulse 89  Temp(Src) 98.5 F (36.9 C) (Oral)  Resp 18  SpO2 100%  LMP   Breastfeeding? No Physical Exam  Constitutional: She is oriented to person, place, and time. She appears well-developed.  HENT:  Head: Normocephalic.  Mucous membranes dry  Eyes: Conjunctivae  and EOM are normal. No scleral icterus.  Neck: Neck supple. No thyromegaly present.  Cardiovascular: Normal rate and regular rhythm.  Exam reveals no gallop and no friction rub.   No murmur heard. Pulmonary/Chest: No stridor. She has no wheezes. She has no rales. She exhibits no tenderness.  Abdominal: She exhibits no distension. There is no tenderness. There is no rebound.  Musculoskeletal: Normal range of motion. She exhibits no edema.  Lymphadenopathy:    She has no cervical adenopathy.  Neurological: She is oriented to person, place, and time. She exhibits normal muscle tone. Coordination normal.  Skin: No rash noted. No erythema.  Psychiatric: She has a normal mood and affect. Her behavior is normal.    ED Course  Procedures (including critical care time) Labs Review Labs Reviewed  COMPREHENSIVE METABOLIC PANEL - Abnormal; Notable for the following:    Glucose, Bld 107 (*)    All other components within normal limits  CBC - Abnormal; Notable for the following:    WBC 11.8 (*)    Hemoglobin 15.9 (*)    All other components within normal limits  URINALYSIS, ROUTINE W REFLEX MICROSCOPIC (NOT AT [facility]) - Abnormal; Notable for the following:    APPearance CLOUDY (*)    Hgb urine dipstick TRACE (*)    Bilirubin Urine SMALL (*)    Ketones, ur 40 (*)    All other components within normal limits  URINE MICROSCOPIC-ADD ON - Abnormal; Notable for the following:    Squamous Epithelial / LPF 6-30 (*)    Bacteria, UA MANY (*)    All other components within normal limits  LIPASE, BLOOD  I-STAT BETA HCG BLOOD, ED ([facility], [facility], [facility] ONLY)  I-STAT CG4 LACTIC ACID, ED    Imaging Review Dg Abd Acute W/chest  03/04/2016  CLINICAL DATA:  Lower center abdominal pain, n/v, diarrhea. Pt states this is the this "stomach bug" she's had in the last 3 weeks. No chest hx. Non-smoker. EXAM: DG ABDOMEN ACUTE W/ 1V CHEST COMPARISON:  None. FINDINGS: Heart size and mediastinal contours are within normal limits.  Lungs are clear. No effusion. No free air. Small bowel and colon are nondistended. A few scattered fluid levels on the erect radiograph. Left pelvic scratch the bilateral pelvic phleboliths. Regional bones unremarkable. IMPRESSION: No acute cardiopulmonary disease. Scattered fluid levels in small and large bowel may indicate early/mild ileus. Electronically Signed   By: Corlis Leak  Hassell M.D.   On: 03/04/2016 17:45   I have personally reviewed and evaluated these images and lab results as part of my medical decision-making.   EKG Interpretation None      MDM   Final diagnoses:  Gastroenteritis    Labs unremarkable white count only 11,000 here. X-rays show a few air-fluid levels.  Will treat patient for gastroenteritis with Bentyl and Zofran and she will follow-up with her GI doctor in 1-2 weeks    Bethann BerkshireJoseph Janaiah Vetrano, MD 03/04/16 1946

## 2016-03-04 NOTE — ED Notes (Signed)
Pt went to urgent care today for nausea, emesis, diarrhea, low abdominal pain x 3 weeks, symptoms resolve and return. Urgent care sent pt to ED for WBCs 19.6.

## 2016-12-31 ENCOUNTER — Ambulatory Visit (INDEPENDENT_AMBULATORY_CARE_PROVIDER_SITE_OTHER): Payer: 59 | Admitting: Physician Assistant

## 2016-12-31 ENCOUNTER — Encounter: Payer: Self-pay | Admitting: Physician Assistant

## 2016-12-31 VITALS — BP 102/70 | HR 72 | Temp 98.4°F | Ht 61.5 in | Wt 104.0 lb

## 2016-12-31 DIAGNOSIS — L0291 Cutaneous abscess, unspecified: Secondary | ICD-10-CM | POA: Diagnosis not present

## 2016-12-31 MED ORDER — DOXYCYCLINE HYCLATE 100 MG PO TABS: 100.0000 mg | ORAL_TABLET | Freq: Two times a day (BID) | ORAL | 0 refills | Status: AC

## 2016-12-31 NOTE — Patient Instructions (Signed)
Start Doxycycline. You may try a Sitz Bath to help with the inflammation. You may also try Ibuprofen, follow package directions, if you need pain relief.   Skin Abscess A skin abscess is an infected area on or under your skin that contains a collection of pus and other material. An abscess may also be called a furuncle, carbuncle, or boil. An abscess can occur in or on almost any part of your body. Some abscesses break open (rupture) on their own. Most continue to get worse unless they are treated. The infection can spread deeper into the body and eventually into your blood, which can make you feel ill. Treatment usually involves draining the abscess. What are the causes? An abscess occurs when germs, often bacteria, pass through your skin and cause an infection. This may be caused by:  A scrape or cut on your skin.  A puncture wound through your skin, including a needle injection.  Blocked oil or sweat glands.  Blocked and infected hair follicles.  A cyst that forms beneath your skin (sebaceous cyst) and becomes infected. What increases the risk? This condition is more likely to develop in people who:  Have a weak body defense system (immune system).  Have diabetes.  Have dry and irritated skin.  Get frequent injections or use illegal IV drugs.  Have a foreign body in a wound, such as a splinter.  Have problems with their lymph system or veins. What are the signs or symptoms? An abscess may start as a painful, firm bump under the skin. Over time, the abscess may get larger or become softer. Pus may appear at the top of the abscess, causing pressure and pain. It may eventually break through the skin and drain. Other symptoms include:  Redness.  Warmth.  Swelling.  Tenderness.  A sore on the skin. How is this diagnosed? This condition is diagnosed based on your medical history and a physical exam. A sample of pus may be taken from the abscess to find out what is causing  the infection and what antibiotics can be used to treat it. You also may have:  Blood tests to look for signs of infection or spread of an infection to your blood.  Imaging studies such as ultrasound, CT scan, or MRI if the abscess is deep. How is this treated? Small abscesses that drain on their own may not need treatment. Treatment for an abscess that does not rupture on its own may include:  Warm compresses applied to the area several times per day.  Incision and drainage. Your health care provider will make an incision to open the abscess and will remove pus and any foreign body or dead tissue. The incision area may be packed with gauze to keep it open for a few days while it heals.  Antibiotic medicines to treat infection. For a severe abscess, you may first get antibiotics through an IV and then change to oral antibiotics. Follow these instructions at home: Abscess Care   If you have an abscess that has not drained, place a warm, clean, wet washcloth over the abscess several times a day. Do this as told by your health care provider.  Follow instructions from your health care provider about how to take care of your abscess. Make sure you:  Cover the abscess with a bandage (dressing).  Change your dressing or gauze as told by your health care provider.  Wash your hands with soap and water before you change the dressing or gauze. If  soap and water are not available, use hand sanitizer.  Check your abscess every day for signs of a worsening infection. Check for:  More redness, swelling, or pain.  More fluid or blood.  Warmth.  More pus or a bad smell. Medicines   Take over-the-counter and prescription medicines only as told by your health care provider.  If you were prescribed an antibiotic medicine, take it as told by your health care provider. Do not stop taking the antibiotic even if you start to feel better. General instructions   To avoid spreading the  infection:  Do not share personal care items, towels, or hot tubs with others.  Avoid making skin contact with other people.  Keep all follow-up visits as told by your health care provider. This is important. Contact a health care provider if:  You have more redness, swelling, or pain around your abscess.  You have more fluid or blood coming from your abscess.  Your abscess feels warm to the touch.  You have more pus or a bad smell coming from your abscess.  You have a fever.  You have muscle aches.  You have chills or a general ill feeling. Get help right away if:  You have severe pain.  You see red streaks on your skin spreading away from the abscess. This information is not intended to replace advice given to you by your health care provider. Make sure you discuss any questions you have with your health care provider. Document Released: 07/23/2005 Document Revised: 06/08/2016 Document Reviewed: 08/22/2015 Elsevier Interactive Patient Education  2017 [employer].  How to Take a [employer] A sitz bath is a warm water bath that is taken while you are sitting down. The water should only come up to your hips and should cover your buttocks. Your health care provider may recommend a sitz bath to help you:  Clean the lower part of your body, including your genital area.  With itching.  With pain.  With sore muscles or muscles that tighten or spasm. How to take a sitz bath Take 3-4 sitz baths per day or as told by your health care provider. 1. Partially fill a bathtub with warm water. You will only need the water to be deep enough to cover your hips and buttocks when you are sitting in it. 2. If your health care provider told you to put medicine in the water, follow the directions exactly. 3. Sit in the water and open the tub drain a little. 4. Turn on the warm water again to keep the tub at the correct level. Keep the water running constantly. 5. Soak in the water for 15-20  minutes or as told by your health care provider. 6. After the sitz bath, pat the affected area dry first. Do not rub it. 7. Be careful when you stand up after the sitz bath because you may feel dizzy. Contact a health care provider if:  Your symptoms get worse. Do not continue with sitz baths if your symptoms get worse.  You have new symptoms. Do not continue with sitz baths until you talk with your health care provider. This information is not intended to replace advice given to you by your health care provider. Make sure you discuss any questions you have with your health care provider. Document Released: 07/05/2004 Document Revised: 03/12/2016 Document Reviewed: 10/11/2014 Elsevier Interactive Patient Education  2017 [employer].

## 2016-12-31 NOTE — Progress Notes (Signed)
Pre visit review using our clinic review tool, if applicable. No additional management support is needed unless otherwise documented below in the visit note. 

## 2016-12-31 NOTE — Progress Notes (Signed)
Subjective:    Patient ID: Domenica Fail, female    DOB: 29-Dec-1980, 36 y.o.   MRN: [id number]  HPI  Ms. Schwarting is a 36 y/o female who presents to clinic today to establish care.  Acute Concerns: Boil - Area of redness and tenderness under her right buttock has been present for 1-1-1/2 weeks. The area is painful especially when she has to sit on the toilet. This area first showed up after her last pregnancy and while she was breast-feeding, she was put on an antibiotic that was sacral pregnancy that cleared up the boil. She denies any fever, chills, drainage, streaking redness from the site. She has not taken any medicine for this. She has not tried anything warm compresses or sitz bath.  Chronic Issues: None  Health Maintenance: Immunizations UTD PAP -- last in Nov 2017  Weight -- Weight: 104 lb (47.2 kg)   Other providers/specialists: Dr. Arelia Sneddon - [facility]   Review of Systems  See HPI  Past Medical History:  Diagnosis Date  . H/O varicella   . Medical history non-contributory   . No pertinent past medical history   . PONV (postoperative nausea and vomiting)      Social History   Social History  . Marital status: Married    Spouse name: N/A  . Number of children: N/A  . Years of education: N/A   Occupational History  . Not on file.   Social History Main Topics  . Smoking status: Never Smoker  . Smokeless tobacco: Never Used  . Alcohol use 1.2 oz/week    1 Glasses of wine, 1 Cans of beer per week  . Drug use: No  . Sexual activity: Yes    Birth control/ protection: Other-see comments     Comment: Husband Vasectomy   Other Topics Concern  . 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 KNEE SURGERY      Family History  Problem Relation Age of Onset  . Other Mother     hemorrage post RVD  requiring hysterectomy  . Hypertension Father   . Diabetes Maternal Uncle   . Stroke Maternal Grandmother   . Thyroid disease Maternal Grandfather   . COPD Maternal Grandfather   . Heart disease Paternal Grandfather   . Parkinsonism Paternal Grandfather   . Arthritis Paternal Grandmother     No Known Allergies  No current outpatient prescriptions on file prior to visit.   No current facility-administered medications on file prior to visit.     BP 102/70 (BP Location: Left Arm, Patient Position: Sitting, Cuff Size: Normal)   Pulse 72   Temp 98.4 F (36.9 C) (Oral)   Ht 5' 1.5" (1.562 m)   Wt 104 lb (47.2 kg)   LMP 12/17/2016   SpO2 99%   BMI 19.33 kg/m       Objective:   Physical Exam  Constitutional: She appears well-developed and well-nourished. She is cooperative.  Non-toxic appearance. She does not have a sickly appearance. She does not appear ill. No distress.  HENT:  Head: Normocephalic and atraumatic.  Cardiovascular: Normal rate, regular rhythm and normal heart sounds.   Pulmonary/Chest: Effort normal and breath sounds normal. No accessory muscle usage. No respiratory distress.  Neurological: She is alert.  Skin:  Skin is warm, dry and intact.     Psychiatric: She has a normal mood and affect. Her speech is normal.  Nursing note and vitals reviewed.     Assessment & Plan:  1. Abscess Area stable. Vital signs stable. There is no indication for I&D at this time. Start doxycycline per orders. Advised patient that she may take ibuprofen as needed for any pain or inflammation. She may also do warm compresses or sitz baths as appropriate. Provided precautions to seek medical attention if she develops worsening pain, redness, fevers, or any other questionable worrisome symptoms. Patient agreed with the plan.  Jarold MottoSamantha Emonii Wienke PA-C 12/31/16

## 2017-01-01 ENCOUNTER — Encounter: Payer: Self-pay | Admitting: Physician Assistant

## 2020-03-28 ENCOUNTER — Other Ambulatory Visit: Payer: Self-pay | Admitting: Obstetrics and Gynecology

## 2020-03-28 DIAGNOSIS — R928 Other abnormal and inconclusive findings on diagnostic imaging of breast: Secondary | ICD-10-CM

## 2020-04-10 ENCOUNTER — Ambulatory Visit
Admission: RE | Admit: 2020-04-10 | Discharge: 2020-04-10 | Disposition: A | Discharge status: Home / Self Care | Payer: 59 | Source: Ambulatory Visit | Attending: Obstetrics and Gynecology | Admitting: Obstetrics and Gynecology

## 2020-04-10 ENCOUNTER — Ambulatory Visit
Admission: RE | Admit: 2020-04-10 | Discharge: 2020-04-10 | Disposition: A | Discharge status: Home / Self Care | Payer: No Typology Code available for payment source | Source: Ambulatory Visit | Attending: Obstetrics and Gynecology | Admitting: Obstetrics and Gynecology

## 2020-04-10 ENCOUNTER — Other Ambulatory Visit: Payer: Self-pay

## 2020-04-10 DIAGNOSIS — R928 Other abnormal and inconclusive findings on diagnostic imaging of breast: Secondary | ICD-10-CM

## 2021-11-22 IMAGING — US US BREAST*L* LIMITED INC AXILLA
1 series · 13 of 18 positions shown · non-contrast
Comparison: 03/23/2020 and early

CLINICAL DATA: Patient returns after screening study for evaluation
of possible masses in both breasts.

EXAM:
DIGITAL DIAGNOSTIC BILATERAL MAMMOGRAM WITH CAD AND TOMO
ULTRASOUND BILATERAL BREAST

[Series 1: us breast*left* limited inc axilla · 0.06mm/px · 13 of 18 slices shown]
[im 1/18]
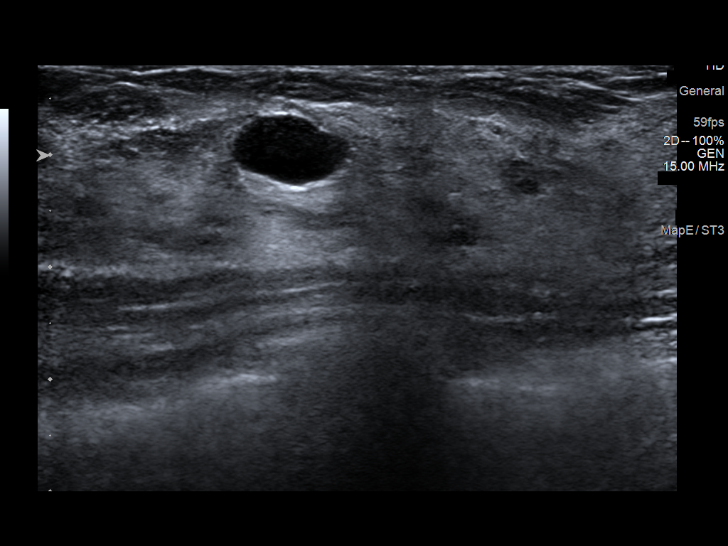
[im 3/18]
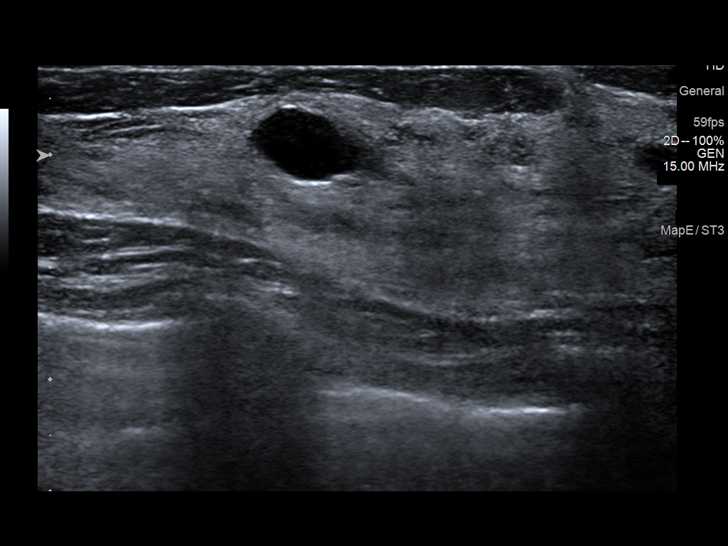
[im 4/18]
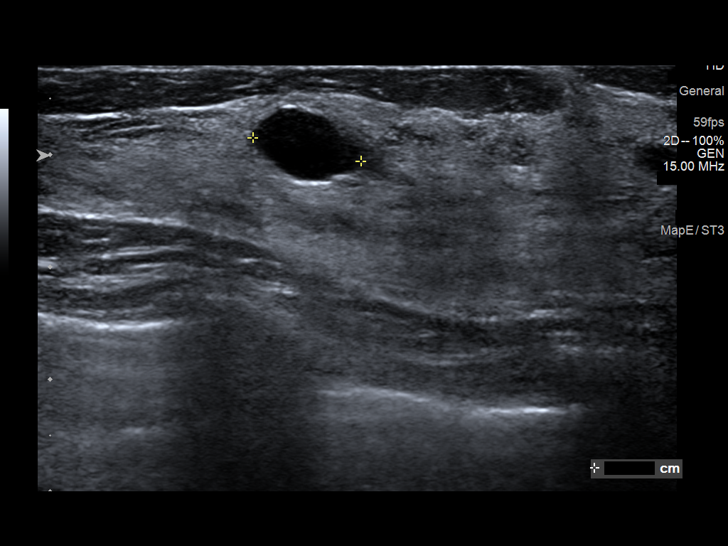
[im 5/18]
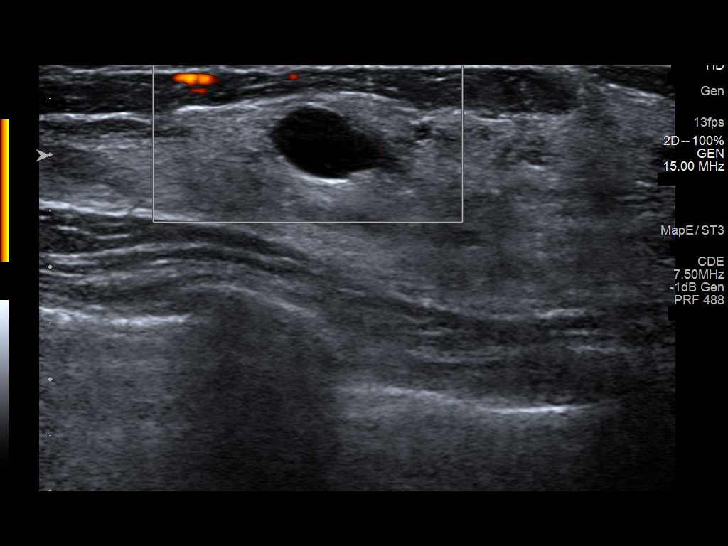
[im 7/18]
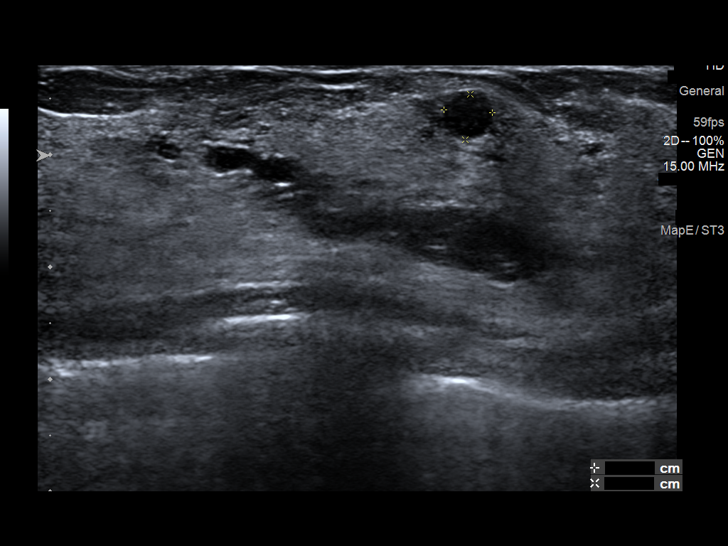
[im 8/18]
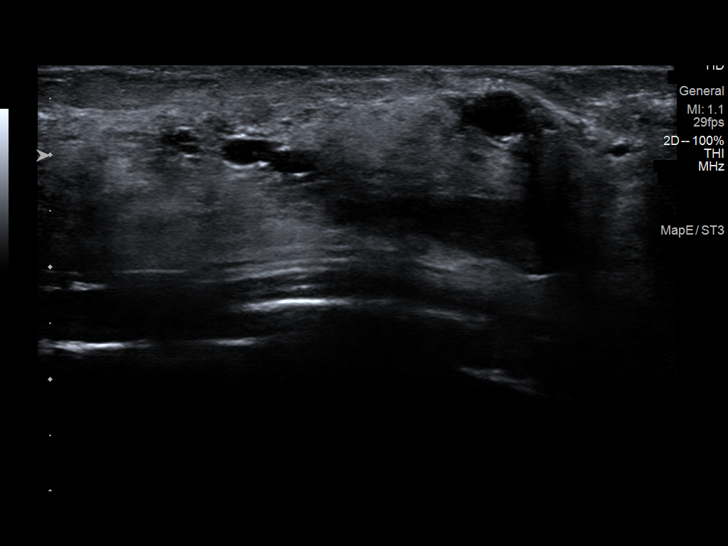
[im 10/18]
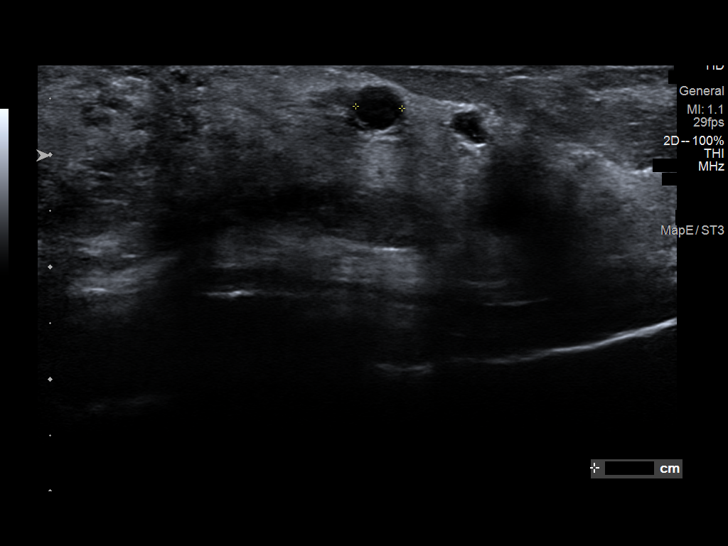
[im 11/18]
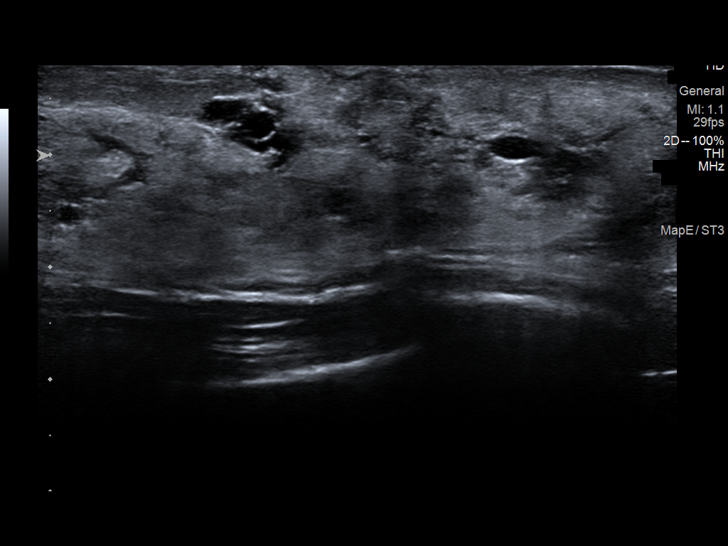
[im 12/18]
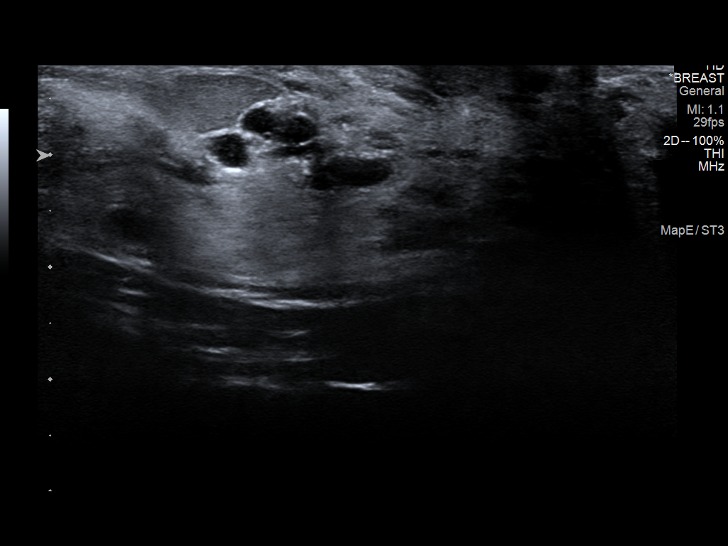
[im 14/18]
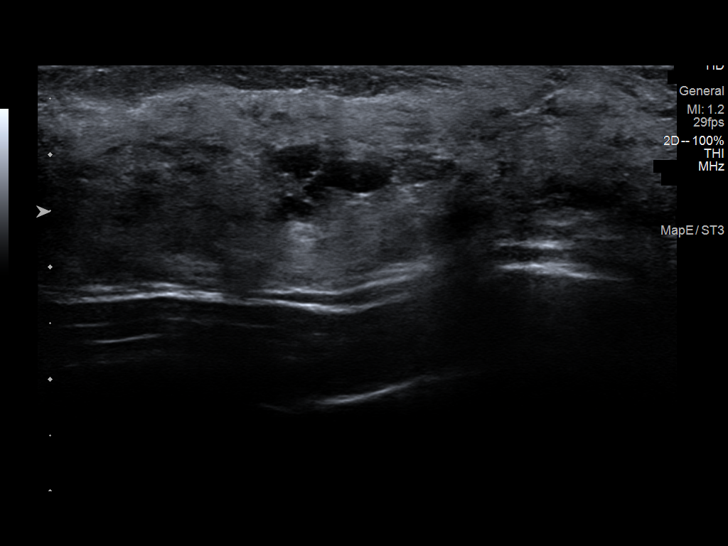
[im 15/18]
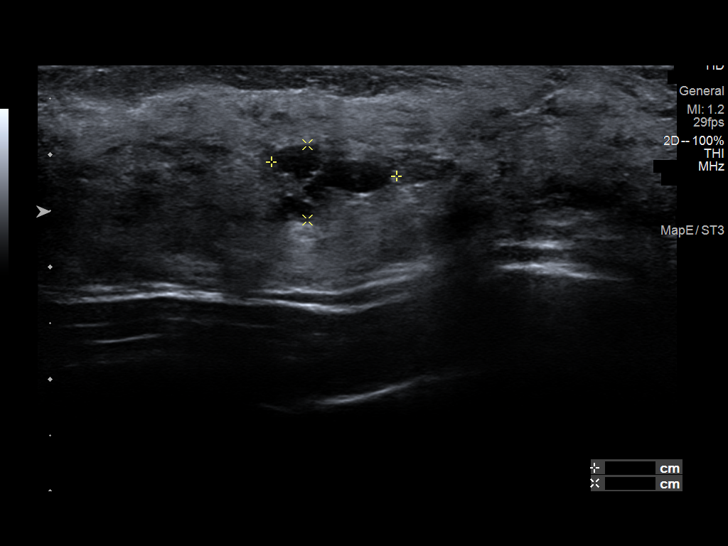
[im 16/18]
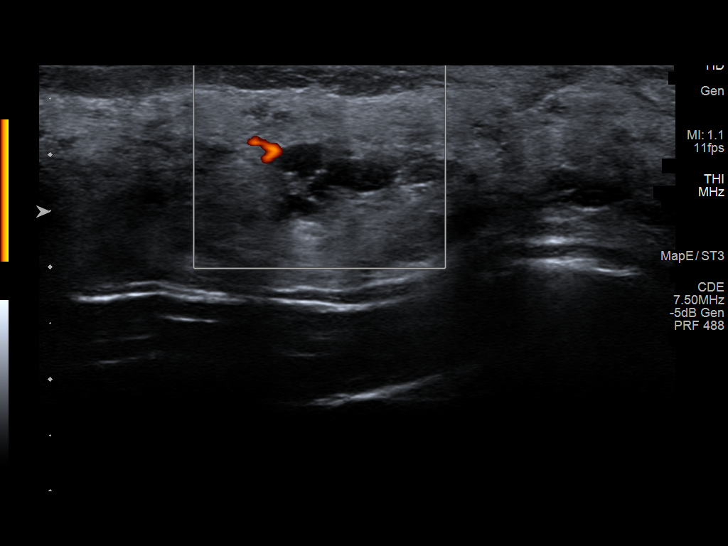
[im 18/18]
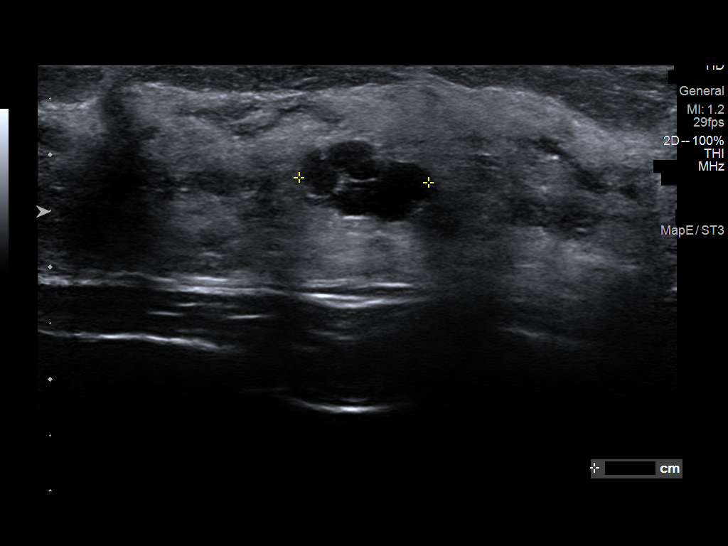

[13 of 18 positions shown; findings below may reference images not displayed]

ACR Breast Density Category c: The breast tissue is heterogeneously
dense, which may obscure small masses.
FINDINGS: Right breast:

Mammogram: Additional 2-D and 3-D images are performed. These views
confirm presence of a circumscribed partially obscured mass in the
LATERAL portion of the RIGHT breast and further evaluated with
ultrasound. Mammographic images were processed with CAD.

Ultrasound: Targeted ultrasound is performed, showing a simple cyst
in the 9 o'clock location of the RIGHT breast 4 centimeters from the
nipple. Cyst measures 1.2 x 1.1 x 0.9 centimeters.

Left breast:

Mammogram: Additional 2-D and 3-D images are performed. These views
confirm presence of 2 circumscribed oval masses in the retroareolar
region of the LEFT breast. Mammographic images were processed with
CAD.

Ultrasound: Targeted ultrasound is performed, showing simple cyst in
the 10 o'clock location of the LEFT breast 2 centimeters from nipple
which measures 1.0 x 0.6 x 1.0 centimeters. A simple cyst in the 6
o'clock location 1 centimeter from the nipple is 0.4 x 0.4 x
centimeters. A group of benign cysts is seen in the 12 o'clock
retroareolar region and measures 1.1 x 0.7 x 1.2 centimeters. No
solid masses or areas of acoustic shadowing are identified.
IMPRESSION: Bilateral fibrocystic changes. No mammographic or ultrasound
evidence for malignancy.

RECOMMENDATION:
Screening mammogram in one year.(Code:Z8-N-VJU)

I have discussed the findings and recommendations with the patient.
If applicable, a reminder letter will be sent to the patient
regarding the next appointment.

BI-RADS CATEGORY  2: Benign.

## 2021-11-22 IMAGING — MG DIGITAL DIAGNOSTIC BILAT W/ TOMO W/ CAD
8 series · 8 of 24 positions shown · non-contrast
Comparison: 03/23/2020 and early

CLINICAL DATA: Patient returns after screening study for evaluation
of possible masses in both breasts.

EXAM:
DIGITAL DIAGNOSTIC BILATERAL MAMMOGRAM WITH CAD AND TOMO
ULTRASOUND BILATERAL BREAST

[R CC synth-2D]
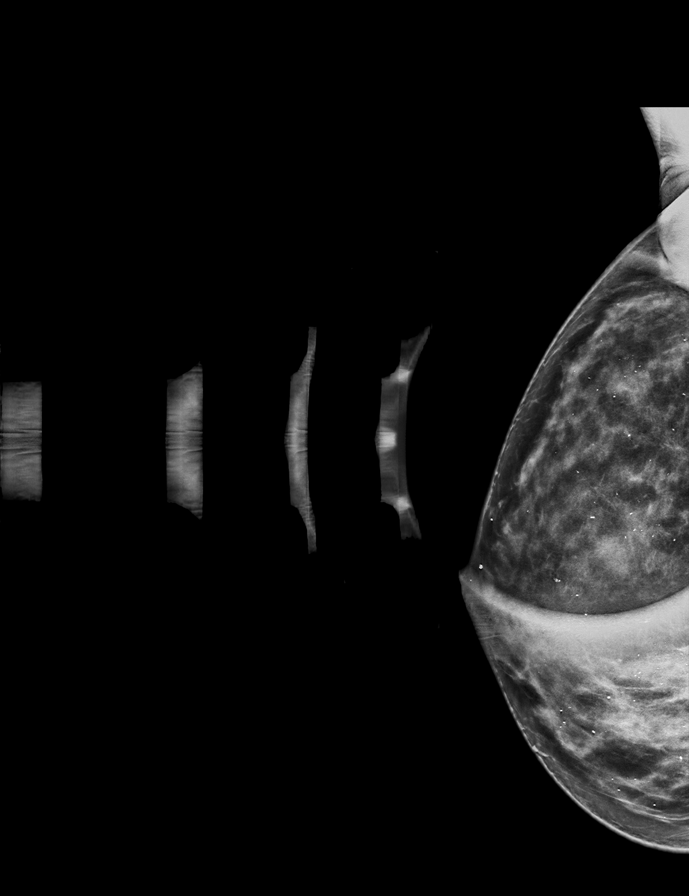

[L ML synth-2D]
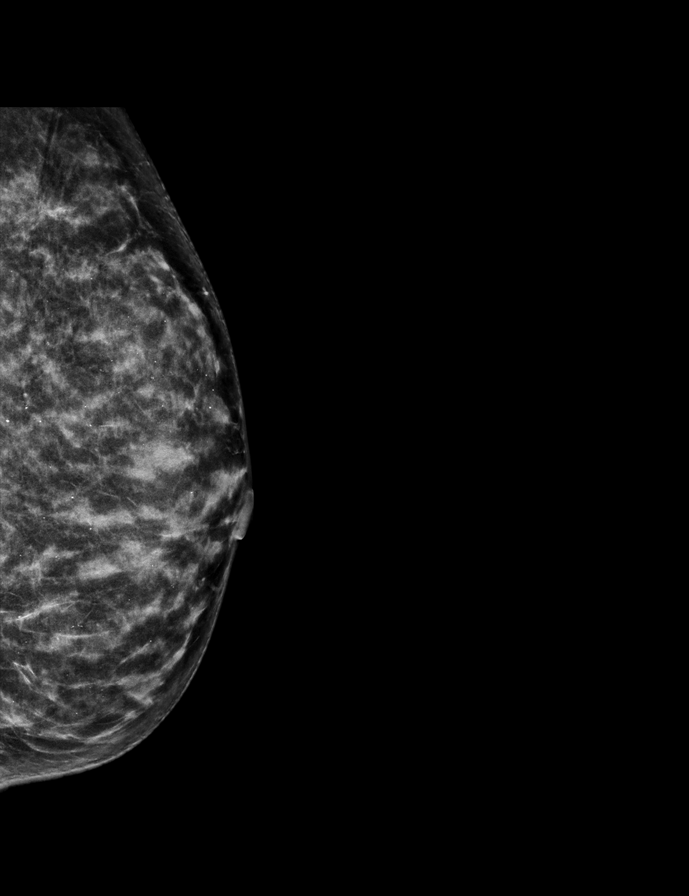

[L CC synth-2D]
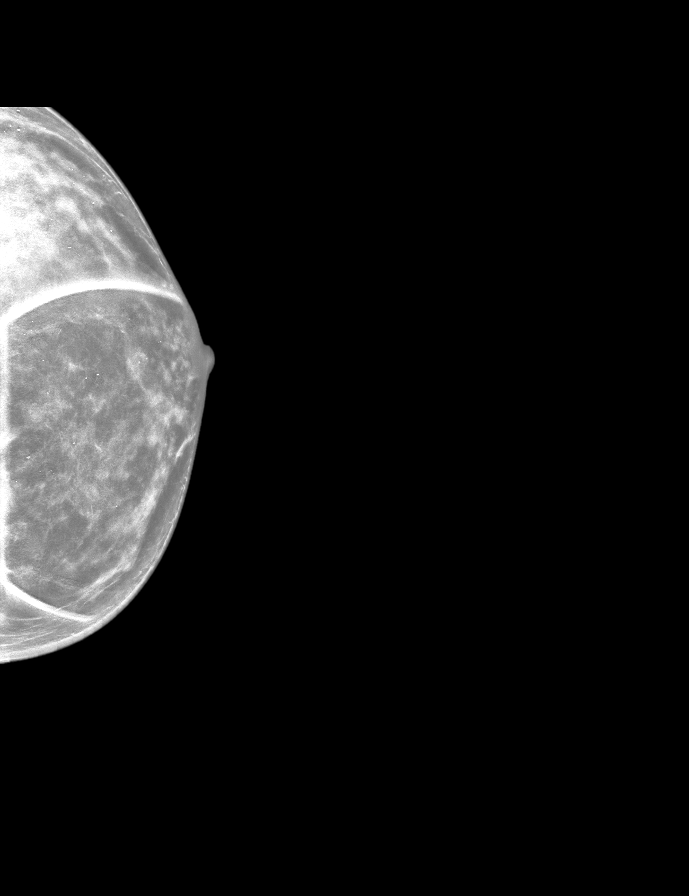

[R MLO synth-2D]
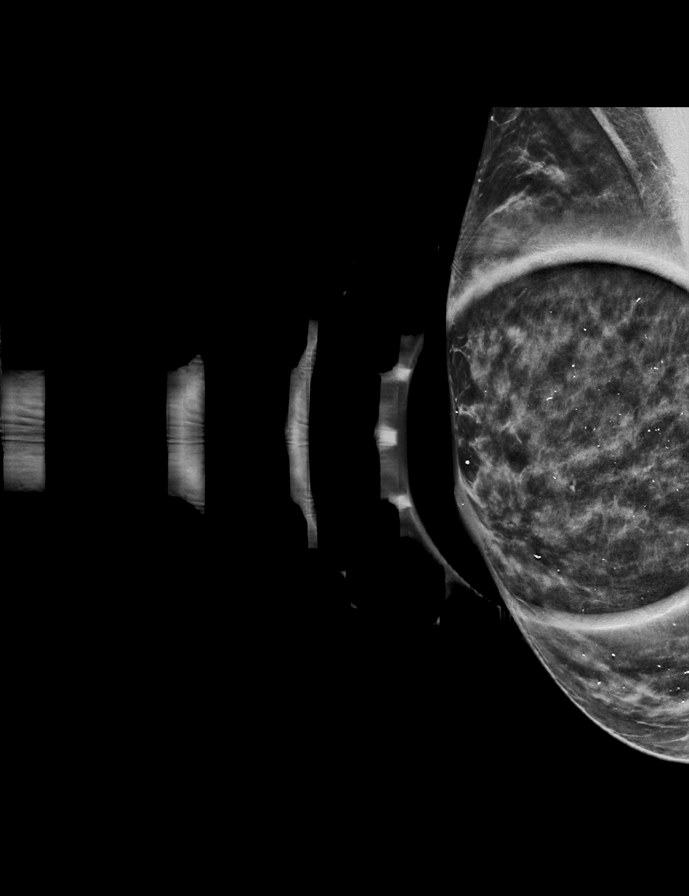

[R MLO tomo · tomo slice 31/60.0]
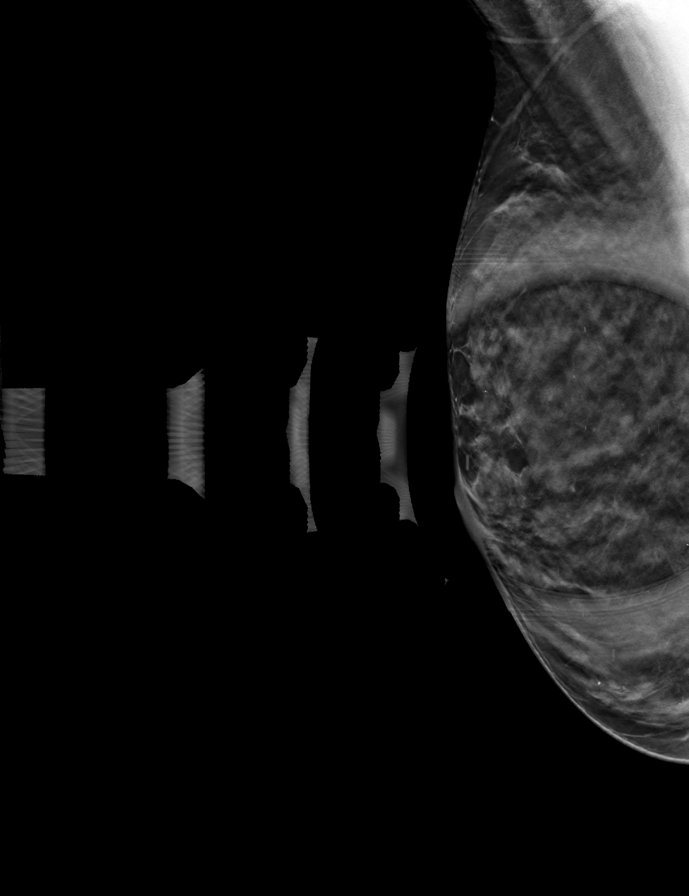

[L CC tomo · tomo slice 29/56.0]
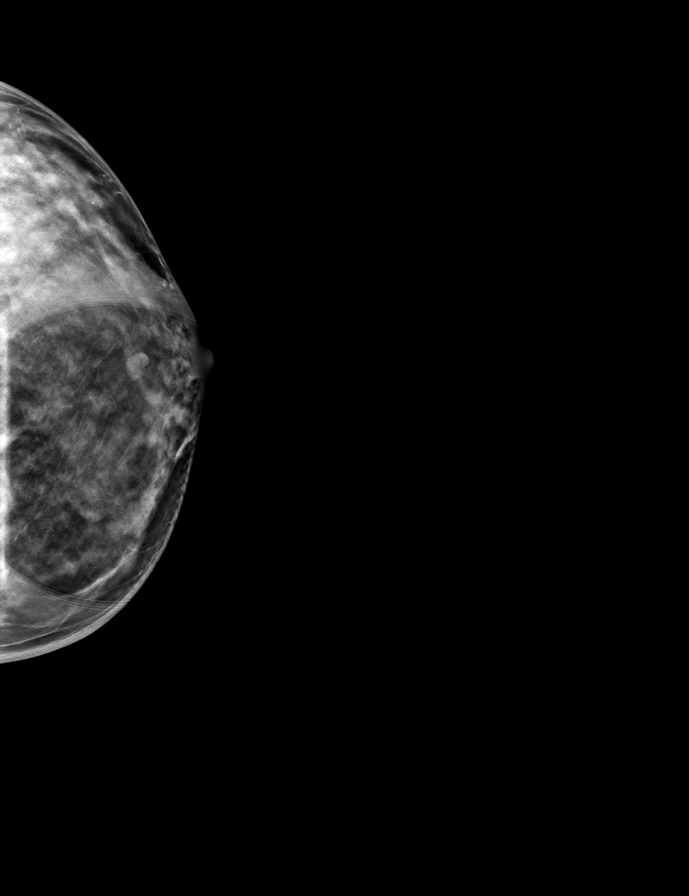

[R CC tomo · tomo slice 31/61.0]
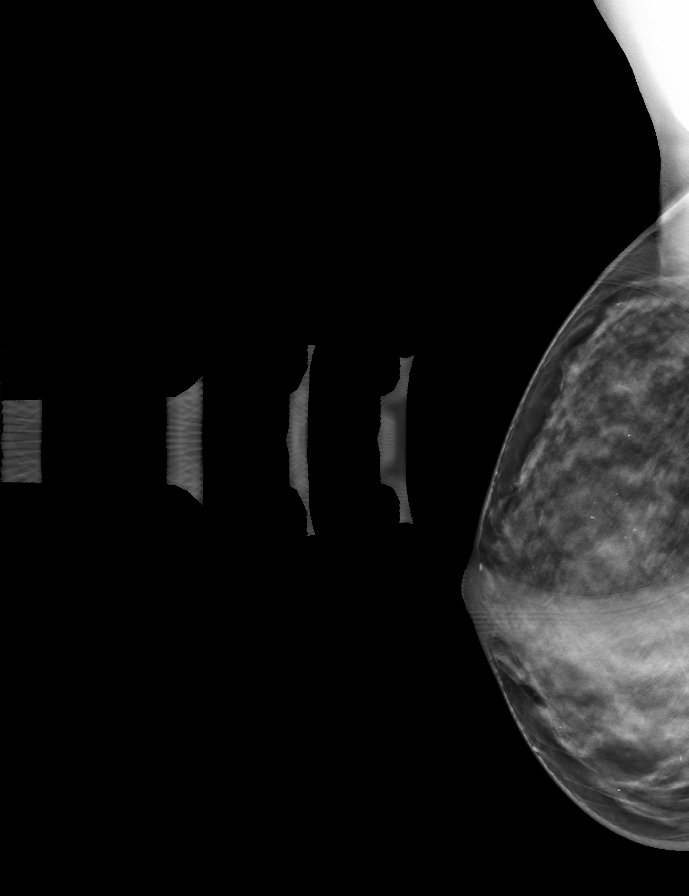

[L ML tomo · tomo slice 27/54.0]
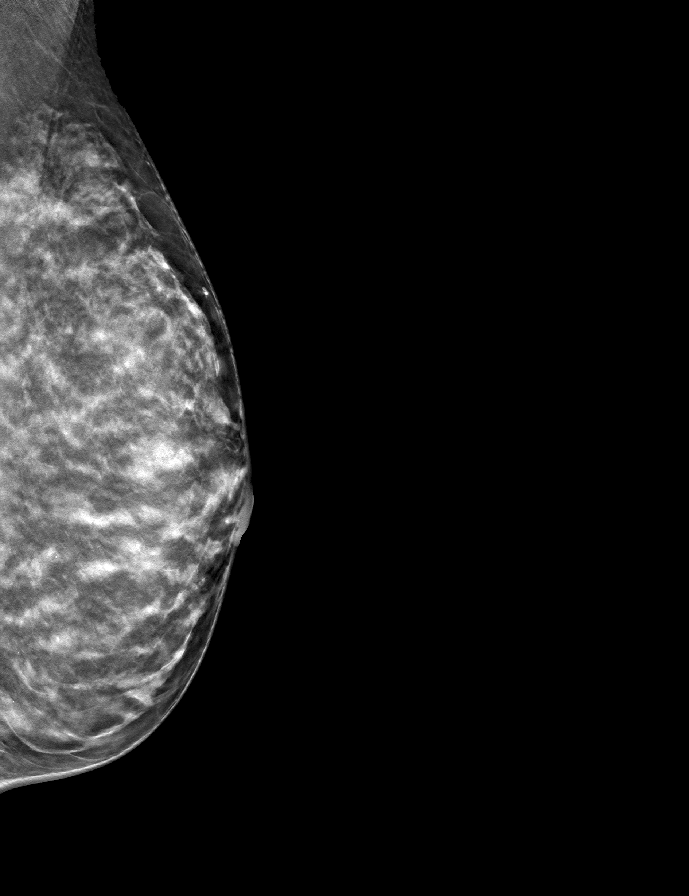

[8 of 24 positions shown; findings below may reference images not displayed]

ACR Breast Density Category c: The breast tissue is heterogeneously
dense, which may obscure small masses.
FINDINGS: Right breast:

Mammogram: Additional 2-D and 3-D images are performed. These views
confirm presence of a circumscribed partially obscured mass in the
LATERAL portion of the RIGHT breast and further evaluated with
ultrasound. Mammographic images were processed with CAD.

Ultrasound: Targeted ultrasound is performed, showing a simple cyst
in the 9 o'clock location of the RIGHT breast 4 centimeters from the
nipple. Cyst measures 1.2 x 1.1 x 0.9 centimeters.

Left breast:

Mammogram: Additional 2-D and 3-D images are performed. These views
confirm presence of 2 circumscribed oval masses in the retroareolar
region of the LEFT breast. Mammographic images were processed with
CAD.

Ultrasound: Targeted ultrasound is performed, showing simple cyst in
the 10 o'clock location of the LEFT breast 2 centimeters from nipple
which measures 1.0 x 0.6 x 1.0 centimeters. A simple cyst in the 6
o'clock location 1 centimeter from the nipple is 0.4 x 0.4 x
centimeters. A group of benign cysts is seen in the 12 o'clock
retroareolar region and measures 1.1 x 0.7 x 1.2 centimeters. No
solid masses or areas of acoustic shadowing are identified.
IMPRESSION: Bilateral fibrocystic changes. No mammographic or ultrasound
evidence for malignancy.

RECOMMENDATION:
Screening mammogram in one year.(Code:Z8-N-VJU)

I have discussed the findings and recommendations with the patient.
If applicable, a reminder letter will be sent to the patient
regarding the next appointment.

BI-RADS CATEGORY  2: Benign.

## 2022-06-18 DIAGNOSIS — Z13220 Encounter for screening for lipoid disorders: Secondary | ICD-10-CM | POA: Diagnosis not present

## 2022-06-18 DIAGNOSIS — Z01419 Encounter for gynecological examination (general) (routine) without abnormal findings: Secondary | ICD-10-CM | POA: Diagnosis not present

## 2022-06-18 DIAGNOSIS — Z1321 Encounter for screening for nutritional disorder: Secondary | ICD-10-CM | POA: Diagnosis not present

## 2022-06-18 DIAGNOSIS — Z1329 Encounter for screening for other suspected endocrine disorder: Secondary | ICD-10-CM | POA: Diagnosis not present

## 2022-06-18 DIAGNOSIS — Z1151 Encounter for screening for human papillomavirus (HPV): Secondary | ICD-10-CM | POA: Diagnosis not present

## 2022-06-18 DIAGNOSIS — Z6820 Body mass index (BMI) 20.0-20.9, adult: Secondary | ICD-10-CM | POA: Diagnosis not present

## 2022-06-18 DIAGNOSIS — Z1231 Encounter for screening mammogram for malignant neoplasm of breast: Secondary | ICD-10-CM | POA: Diagnosis not present

## 2022-06-18 DIAGNOSIS — Z131 Encounter for screening for diabetes mellitus: Secondary | ICD-10-CM | POA: Diagnosis not present

## 2022-06-18 DIAGNOSIS — Z124 Encounter for screening for malignant neoplasm of cervix: Secondary | ICD-10-CM | POA: Diagnosis not present

## 2022-06-23 ENCOUNTER — Other Ambulatory Visit: Payer: Self-pay | Admitting: Obstetrics and Gynecology

## 2022-06-23 DIAGNOSIS — R928 Other abnormal and inconclusive findings on diagnostic imaging of breast: Secondary | ICD-10-CM

## 2022-07-04 ENCOUNTER — Ambulatory Visit
Admission: RE | Admit: 2022-07-04 | Discharge: 2022-07-04 | Disposition: A | Discharge status: Home / Self Care | Payer: BC Managed Care – PPO | Source: Ambulatory Visit | Attending: Obstetrics and Gynecology | Admitting: Obstetrics and Gynecology

## 2022-07-04 ENCOUNTER — Ambulatory Visit
Admission: RE | Admit: 2022-07-04 | Discharge: 2022-07-04 | Disposition: A | Discharge status: Home / Self Care | Payer: No Typology Code available for payment source | Source: Ambulatory Visit | Attending: Obstetrics and Gynecology | Admitting: Obstetrics and Gynecology

## 2022-07-04 DIAGNOSIS — R928 Other abnormal and inconclusive findings on diagnostic imaging of breast: Secondary | ICD-10-CM

## 2022-07-04 DIAGNOSIS — N6011 Diffuse cystic mastopathy of right breast: Secondary | ICD-10-CM | POA: Diagnosis not present

## 2022-07-04 DIAGNOSIS — R922 Inconclusive mammogram: Secondary | ICD-10-CM | POA: Diagnosis not present

## 2022-07-21 DIAGNOSIS — L578 Other skin changes due to chronic exposure to nonionizing radiation: Secondary | ICD-10-CM | POA: Diagnosis not present

## 2022-07-21 DIAGNOSIS — D2272 Melanocytic nevi of left lower limb, including hip: Secondary | ICD-10-CM | POA: Diagnosis not present

## 2022-07-21 DIAGNOSIS — L570 Actinic keratosis: Secondary | ICD-10-CM | POA: Diagnosis not present

## 2022-07-21 DIAGNOSIS — L111 Transient acantholytic dermatosis [Grover]: Secondary | ICD-10-CM | POA: Diagnosis not present

## 2022-07-21 DIAGNOSIS — L814 Other melanin hyperpigmentation: Secondary | ICD-10-CM | POA: Diagnosis not present

## 2022-10-08 DIAGNOSIS — Z23 Encounter for immunization: Secondary | ICD-10-CM | POA: Diagnosis not present

## 2022-12-24 DIAGNOSIS — D485 Neoplasm of uncertain behavior of skin: Secondary | ICD-10-CM | POA: Diagnosis not present

## 2022-12-24 DIAGNOSIS — D0461 Carcinoma in situ of skin of right upper limb, including shoulder: Secondary | ICD-10-CM | POA: Diagnosis not present

## 2022-12-31 DIAGNOSIS — D485 Neoplasm of uncertain behavior of skin: Secondary | ICD-10-CM | POA: Diagnosis not present

## 2023-01-05 DIAGNOSIS — L0889 Other specified local infections of the skin and subcutaneous tissue: Secondary | ICD-10-CM | POA: Diagnosis not present

## 2023-05-15 DIAGNOSIS — W5689XA Other contact with other nonvenomous marine animals, initial encounter: Secondary | ICD-10-CM | POA: Diagnosis not present

## 2023-05-15 DIAGNOSIS — S90852A Superficial foreign body, left foot, initial encounter: Secondary | ICD-10-CM | POA: Diagnosis not present

## 2023-05-15 DIAGNOSIS — R2689 Other abnormalities of gait and mobility: Secondary | ICD-10-CM | POA: Diagnosis not present

## 2023-05-15 DIAGNOSIS — M79672 Pain in left foot: Secondary | ICD-10-CM | POA: Diagnosis not present

## 2023-05-27 ENCOUNTER — Other Ambulatory Visit: Payer: Self-pay | Admitting: Obstetrics and Gynecology

## 2023-05-27 DIAGNOSIS — Z1231 Encounter for screening mammogram for malignant neoplasm of breast: Secondary | ICD-10-CM

## 2023-07-06 ENCOUNTER — Ambulatory Visit
Admission: RE | Admit: 2023-07-06 | Discharge: 2023-07-06 | Disposition: A | Discharge status: Home / Self Care | Payer: BC Managed Care – PPO | Source: Ambulatory Visit | Attending: Obstetrics and Gynecology | Admitting: Obstetrics and Gynecology

## 2023-07-06 DIAGNOSIS — Z1231 Encounter for screening mammogram for malignant neoplasm of breast: Secondary | ICD-10-CM | POA: Diagnosis not present

## 2023-07-23 DIAGNOSIS — L814 Other melanin hyperpigmentation: Secondary | ICD-10-CM | POA: Diagnosis not present

## 2023-07-23 DIAGNOSIS — L820 Inflamed seborrheic keratosis: Secondary | ICD-10-CM | POA: Diagnosis not present

## 2023-07-23 DIAGNOSIS — D2272 Melanocytic nevi of left lower limb, including hip: Secondary | ICD-10-CM | POA: Diagnosis not present

## 2023-07-23 DIAGNOSIS — Z85828 Personal history of other malignant neoplasm of skin: Secondary | ICD-10-CM | POA: Diagnosis not present

## 2023-07-23 DIAGNOSIS — L578 Other skin changes due to chronic exposure to nonionizing radiation: Secondary | ICD-10-CM | POA: Diagnosis not present

## 2023-08-12 DIAGNOSIS — Z23 Encounter for immunization: Secondary | ICD-10-CM | POA: Diagnosis not present

## 2023-08-24 DIAGNOSIS — Z6820 Body mass index (BMI) 20.0-20.9, adult: Secondary | ICD-10-CM | POA: Diagnosis not present

## 2023-08-24 DIAGNOSIS — Z01419 Encounter for gynecological examination (general) (routine) without abnormal findings: Secondary | ICD-10-CM | POA: Diagnosis not present

## 2023-08-25 ENCOUNTER — Other Ambulatory Visit: Payer: Self-pay | Admitting: Obstetrics and Gynecology

## 2023-08-25 DIAGNOSIS — E041 Nontoxic single thyroid nodule: Secondary | ICD-10-CM

## 2023-08-31 ENCOUNTER — Ambulatory Visit
Admission: RE | Admit: 2023-08-31 | Discharge: 2023-08-31 | Disposition: A | Discharge status: Home / Self Care | Payer: BC Managed Care – PPO | Source: Ambulatory Visit | Attending: Obstetrics and Gynecology | Admitting: Obstetrics and Gynecology

## 2023-08-31 DIAGNOSIS — E042 Nontoxic multinodular goiter: Secondary | ICD-10-CM | POA: Diagnosis not present

## 2023-08-31 DIAGNOSIS — E041 Nontoxic single thyroid nodule: Secondary | ICD-10-CM

## 2023-09-03 DIAGNOSIS — Z13228 Encounter for screening for other metabolic disorders: Secondary | ICD-10-CM | POA: Diagnosis not present

## 2023-09-03 DIAGNOSIS — Z1329 Encounter for screening for other suspected endocrine disorder: Secondary | ICD-10-CM | POA: Diagnosis not present

## 2023-09-03 DIAGNOSIS — Z1321 Encounter for screening for nutritional disorder: Secondary | ICD-10-CM | POA: Diagnosis not present

## 2023-09-03 DIAGNOSIS — Z131 Encounter for screening for diabetes mellitus: Secondary | ICD-10-CM | POA: Diagnosis not present

## 2023-09-03 DIAGNOSIS — Z13220 Encounter for screening for lipoid disorders: Secondary | ICD-10-CM | POA: Diagnosis not present

## 2023-09-15 ENCOUNTER — Other Ambulatory Visit: Payer: Self-pay | Admitting: Obstetrics and Gynecology

## 2023-09-15 DIAGNOSIS — E041 Nontoxic single thyroid nodule: Secondary | ICD-10-CM

## 2023-09-22 ENCOUNTER — Ambulatory Visit
Admission: RE | Admit: 2023-09-22 | Discharge: 2023-09-22 | Disposition: A | Discharge status: Home / Self Care | Payer: BC Managed Care – PPO | Source: Ambulatory Visit | Attending: Obstetrics and Gynecology | Admitting: Obstetrics and Gynecology

## 2023-09-22 ENCOUNTER — Other Ambulatory Visit (HOSPITAL_COMMUNITY)
Admission: RE | Admit: 2023-09-22 | Discharge: 2023-09-22 | Disposition: A | Discharge status: Home / Self Care | Payer: BC Managed Care – PPO | Source: Ambulatory Visit | Attending: Diagnostic Radiology | Admitting: Diagnostic Radiology

## 2023-09-22 ENCOUNTER — Inpatient Hospital Stay
Admission: RE | Admit: 2023-09-22 | Discharge: 2023-09-22 | Disposition: A | Discharge status: Home / Self Care | Payer: BC Managed Care – PPO | Source: Ambulatory Visit | Attending: Obstetrics and Gynecology | Admitting: Obstetrics and Gynecology

## 2023-09-22 DIAGNOSIS — E041 Nontoxic single thyroid nodule: Secondary | ICD-10-CM | POA: Insufficient documentation

## 2023-09-25 LAB — CYTOLOGY - NON PAP

## 2023-11-30 DIAGNOSIS — E042 Nontoxic multinodular goiter: Secondary | ICD-10-CM | POA: Diagnosis not present

## 2024-08-04 DIAGNOSIS — Z85828 Personal history of other malignant neoplasm of skin: Secondary | ICD-10-CM | POA: Diagnosis not present

## 2024-08-04 DIAGNOSIS — L820 Inflamed seborrheic keratosis: Secondary | ICD-10-CM | POA: Diagnosis not present

## 2024-08-04 DIAGNOSIS — L814 Other melanin hyperpigmentation: Secondary | ICD-10-CM | POA: Diagnosis not present

## 2024-08-04 DIAGNOSIS — D0439 Carcinoma in situ of skin of other parts of face: Secondary | ICD-10-CM | POA: Diagnosis not present

## 2024-08-04 DIAGNOSIS — C44319 Basal cell carcinoma of skin of other parts of face: Secondary | ICD-10-CM | POA: Diagnosis not present

## 2024-08-04 DIAGNOSIS — L578 Other skin changes due to chronic exposure to nonionizing radiation: Secondary | ICD-10-CM | POA: Diagnosis not present

## 2024-08-04 DIAGNOSIS — D485 Neoplasm of uncertain behavior of skin: Secondary | ICD-10-CM | POA: Diagnosis not present

## 2024-08-04 DIAGNOSIS — D2272 Melanocytic nevi of left lower limb, including hip: Secondary | ICD-10-CM | POA: Diagnosis not present

## 2024-08-25 DIAGNOSIS — E042 Nontoxic multinodular goiter: Secondary | ICD-10-CM | POA: Diagnosis not present

## 2024-08-30 DIAGNOSIS — C44319 Basal cell carcinoma of skin of other parts of face: Secondary | ICD-10-CM | POA: Diagnosis not present

## 2024-09-01 ENCOUNTER — Other Ambulatory Visit: Payer: Self-pay | Admitting: Nurse Practitioner

## 2024-09-01 DIAGNOSIS — E042 Nontoxic multinodular goiter: Secondary | ICD-10-CM

## 2024-09-07 ENCOUNTER — Ambulatory Visit
Admission: RE | Admit: 2024-09-07 | Discharge: 2024-09-07 | Disposition: A | Discharge status: Home / Self Care | Source: Ambulatory Visit | Attending: Nurse Practitioner | Admitting: Nurse Practitioner

## 2024-09-07 DIAGNOSIS — E042 Nontoxic multinodular goiter: Secondary | ICD-10-CM

## 2024-09-13 DIAGNOSIS — C44329 Squamous cell carcinoma of skin of other parts of face: Secondary | ICD-10-CM | POA: Diagnosis not present
